# Patient Record
Sex: Female | Born: 1950 | Race: White | Hispanic: No | Marital: Married | Smoking: Never smoker
Health system: Southern US, Community
[De-identification: ages and names within clinical notes are randomized; demographics above are authoritative.]

## PROBLEM LIST (undated history)

## (undated) DIAGNOSIS — E162 Hypoglycemia, unspecified: Secondary | ICD-10-CM

## (undated) DIAGNOSIS — R079 Chest pain, unspecified: Secondary | ICD-10-CM

## (undated) DIAGNOSIS — R42 Dizziness and giddiness: Secondary | ICD-10-CM

## (undated) DIAGNOSIS — R011 Cardiac murmur, unspecified: Secondary | ICD-10-CM

## (undated) DIAGNOSIS — H269 Unspecified cataract: Secondary | ICD-10-CM

## (undated) DIAGNOSIS — I209 Angina pectoris, unspecified: Secondary | ICD-10-CM

## (undated) DIAGNOSIS — R112 Nausea with vomiting, unspecified: Secondary | ICD-10-CM

## (undated) DIAGNOSIS — T7840XA Allergy, unspecified, initial encounter: Secondary | ICD-10-CM

## (undated) DIAGNOSIS — K219 Gastro-esophageal reflux disease without esophagitis: Secondary | ICD-10-CM

## (undated) DIAGNOSIS — Z9889 Other specified postprocedural states: Secondary | ICD-10-CM

## (undated) DIAGNOSIS — Z87442 Personal history of urinary calculi: Secondary | ICD-10-CM

## (undated) HISTORY — DX: Cardiac murmur, unspecified: R01.1

## (undated) HISTORY — DX: Chest pain, unspecified: R07.9

## (undated) HISTORY — PX: CATARACT EXTRACTION: SUR2

## (undated) HISTORY — PX: POLYPECTOMY: SHX149

## (undated) HISTORY — PX: EYE SURGERY: SHX253

## (undated) HISTORY — DX: Unspecified cataract: H26.9

## (undated) HISTORY — DX: Dizziness and giddiness: R42

## (undated) HISTORY — DX: Allergy, unspecified, initial encounter: T78.40XA

## (undated) HISTORY — PX: COLONOSCOPY: SHX174

## (undated) HISTORY — DX: Gastro-esophageal reflux disease without esophagitis: K21.9

## (undated) HISTORY — PX: OTOPLASTY: SUR998

---

## 1999-06-10 ENCOUNTER — Other Ambulatory Visit: Admission: RE | Admit: 1999-06-10 | Discharge: 1999-06-10 | Payer: Self-pay | Admitting: Obstetrics and Gynecology

## 2000-06-20 ENCOUNTER — Other Ambulatory Visit: Admission: RE | Admit: 2000-06-20 | Discharge: 2000-06-20 | Payer: Self-pay | Admitting: Obstetrics and Gynecology

## 2001-07-10 ENCOUNTER — Other Ambulatory Visit: Admission: RE | Admit: 2001-07-10 | Discharge: 2001-07-10 | Payer: Self-pay | Admitting: Obstetrics and Gynecology

## 2001-12-21 ENCOUNTER — Encounter: Admission: RE | Admit: 2001-12-21 | Discharge: 2001-12-21 | Payer: Self-pay | Admitting: Family Medicine

## 2001-12-21 ENCOUNTER — Encounter: Payer: Self-pay | Admitting: Family Medicine

## 2003-01-08 ENCOUNTER — Encounter: Admission: RE | Admit: 2003-01-08 | Discharge: 2003-01-08 | Payer: Self-pay | Admitting: Family Medicine

## 2003-01-08 ENCOUNTER — Encounter: Payer: Self-pay | Admitting: Family Medicine

## 2005-12-30 ENCOUNTER — Encounter: Admission: RE | Admit: 2005-12-30 | Discharge: 2005-12-30 | Payer: Self-pay | Admitting: Family Medicine

## 2006-11-24 ENCOUNTER — Encounter (INDEPENDENT_AMBULATORY_CARE_PROVIDER_SITE_OTHER): Payer: Self-pay | Admitting: *Deleted

## 2006-11-24 ENCOUNTER — Ambulatory Visit (HOSPITAL_COMMUNITY): Admission: RE | Admit: 2006-11-24 | Discharge: 2006-11-24 | Payer: Self-pay | Admitting: Obstetrics and Gynecology

## 2007-08-10 ENCOUNTER — Encounter: Admission: RE | Admit: 2007-08-10 | Discharge: 2007-08-10 | Payer: Self-pay | Admitting: Obstetrics and Gynecology

## 2009-02-24 ENCOUNTER — Encounter (INDEPENDENT_AMBULATORY_CARE_PROVIDER_SITE_OTHER): Payer: Self-pay | Admitting: *Deleted

## 2010-04-22 ENCOUNTER — Encounter: Payer: Self-pay | Admitting: Gastroenterology

## 2011-03-18 NOTE — Op Note (Signed)
Jenny Henderson, Jenny Henderson                ACCOUNT NO.:  [id number]   MEDICAL RECORD NO.:  [id number]          PATIENT TYPE:  AMB   LOCATION:  SDC                           FACILITY:  [facility]   PHYSICIAN:  Zelphia Cairo, MD    DATE OF BIRTH:  29-Jul-1951   DATE OF PROCEDURE:  11/24/2006  DATE OF DISCHARGE:                               OPERATIVE REPORT   PREOPERATIVE DIAGNOSIS:  Post menopausal bleeding.   POSTOPERATIVE DIAGNOSIS:  Endometrial mass, post menopausal bleeding,  pathology pending.   PROCEDURE:  Hysteroscopy, dilatation and curettage, polypectomy.   SURGEON:  Zelphia Cairo, M.D.   ASSISTANT:  None.   ANESTHESIA:  General.   SPECIMEN:  1. Endometrial polyp/mass.  2. Endometrial curettings.   ESTIMATED BLOOD LOSS:  Minimal.   COMPLICATIONS:  None.   CONDITION:  Extubated to the recovery room in stable condition.   DESCRIPTION OF PROCEDURE:  The patient was taken to the operating room  where general anesthesia was obtained. She was placed in the dorsal  lithotomy position using Allen stirrups.  She was prepped and draped in  a sterile fashion.  In and out sterile catheter was used to drain her  bladder for approximately 150 mL of clear urine.  A bivalve speculum was  then placed in the vagina and the cervical os could not be located.  The  bivalve speculum was removed.  On bimanual exam, adhesions of the cervix  to the posterior fornix of the vagina were disrupted manually.  The  bivalve speculum was then placed back into the vagina and a single tooth  tenaculum was used to grasp the anterior lip of the cervix.  A yellow  plastic os finder was used to locate the cervical os and gently dilate  the cervix.  The cervix was then gently dilated using Pratt dilators.   The diagnostic hysteroscope was then inserted into the uterus and the  cavity was observed.  Of note, there was a long slender endometrial  polyp or mass noted upon entering the internal cervical os.   Otherwise,  the endometrium appeared atrophic and without masses or discoloration.  The left ostia was visualized and appeared normal.  The right ostia  could not be visualized.  The endometrial mass or polyp appeared to be  adhered to the right and posterior wall of the uterus.  The hysteroscope  was then removed and polyp forceps were used to remove pieces of the  polyp.  An endometrial curetting was then performed.  The hysteroscope  was then reinserted and the majority of the polyp was found to be  removed.  Only a small amount of its base remained within the uterus.  Specimens were sent off to pathology.  Following D&C, 1% lidocaine was  used to provide local anesthesia.  The posterior cervix had an area of  laceration that was bleeding.  Two figure-of-eight stitches using 2-0  Vicryl were used to provide hemostasis.  The tenaculum and speculum were  then removed from the cervix and the patient was taken to the recovery  room in stable condition.  Zelphia Cairo, MD  Electronically Signed     GA/MEDQ  D:  11/24/2006  T:  11/24/2006  Job:  [id number]

## 2012-08-24 ENCOUNTER — Other Ambulatory Visit: Payer: Self-pay | Admitting: Emergency Medicine

## 2012-08-24 DIAGNOSIS — R0989 Other specified symptoms and signs involving the circulatory and respiratory systems: Secondary | ICD-10-CM

## 2012-08-27 ENCOUNTER — Ambulatory Visit
Admission: RE | Admit: 2012-08-27 | Discharge: 2012-08-27 | Disposition: A | Discharge status: Home / Self Care | Payer: BC Managed Care – PPO | Source: Ambulatory Visit | Attending: Emergency Medicine | Admitting: Emergency Medicine

## 2012-08-27 DIAGNOSIS — R0989 Other specified symptoms and signs involving the circulatory and respiratory systems: Secondary | ICD-10-CM

## 2013-02-22 ENCOUNTER — Other Ambulatory Visit: Payer: Self-pay | Admitting: Otolaryngology

## 2013-02-22 DIAGNOSIS — H903 Sensorineural hearing loss, bilateral: Secondary | ICD-10-CM

## 2013-02-22 DIAGNOSIS — H9312 Tinnitus, left ear: Secondary | ICD-10-CM

## 2013-03-01 ENCOUNTER — Other Ambulatory Visit: Payer: Self-pay | Admitting: Otolaryngology

## 2013-03-01 ENCOUNTER — Ambulatory Visit
Admission: RE | Admit: 2013-03-01 | Discharge: 2013-03-01 | Disposition: A | Discharge status: Home / Self Care | Payer: BC Managed Care – PPO | Source: Ambulatory Visit | Attending: Otolaryngology | Admitting: Otolaryngology

## 2013-03-01 DIAGNOSIS — H903 Sensorineural hearing loss, bilateral: Secondary | ICD-10-CM

## 2013-03-01 DIAGNOSIS — H9312 Tinnitus, left ear: Secondary | ICD-10-CM

## 2013-03-01 MED ORDER — GADOBENATE DIMEGLUMINE 529 MG/ML IV SOLN
13.0000 mL | Freq: Once | INTRAVENOUS | Status: AC | PRN
  Administered 2013-03-01: 13 mL via INTRAVENOUS

## 2013-03-03 ENCOUNTER — Other Ambulatory Visit: Payer: BC Managed Care – PPO

## 2013-03-04 ENCOUNTER — Inpatient Hospital Stay: Admission: RE | Admit: 2013-03-04 | Payer: BC Managed Care – PPO | Source: Ambulatory Visit

## 2013-03-07 ENCOUNTER — Other Ambulatory Visit: Payer: BC Managed Care – PPO

## 2014-04-07 ENCOUNTER — Encounter: Payer: Self-pay | Admitting: Gastroenterology

## 2014-06-24 ENCOUNTER — Encounter: Payer: Self-pay | Admitting: Gastroenterology

## 2014-08-04 ENCOUNTER — Ambulatory Visit (AMBULATORY_SURGERY_CENTER): Payer: Self-pay

## 2014-08-04 ENCOUNTER — Telehealth: Payer: Self-pay | Admitting: Gastroenterology

## 2014-08-04 VITALS — Ht 62.0 in | Wt 139.2 lb

## 2014-08-04 DIAGNOSIS — Z1211 Encounter for screening for malignant neoplasm of colon: Secondary | ICD-10-CM

## 2014-08-04 MED ORDER — SUPREP BOWEL PREP KIT 17.5-3.13-1.6 GM/177ML PO SOLN: 1.0000 | Freq: Once | ORAL | Status: DC

## 2014-08-04 NOTE — Progress Notes (Signed)
No allergies to eggs or soy  No past problems with anesthesia (PONV with general) No diet/weight loss meds No home oxygen  Has email  Emmi instructions given for colonoscopy

## 2014-08-13 NOTE — Telephone Encounter (Signed)
[facility] [place] states pt picked up her prep on 10-6. Called home number and left a message for pt to call us if she has issues with her prep still.   Lelan Pons PV

## 2014-08-14 ENCOUNTER — Telehealth: Payer: Self-pay

## 2014-08-14 NOTE — Telephone Encounter (Signed)
Pt called stating she ws returning a phone call regarding the bowel prep. Pt states Suprep bowel prep was sent to [place], [place] instead of [place], [place]. [facility] [place] [facility] called the pt and corrected the mistake. The pt does have the correct bowel prep and wanted [place] to know. She will call back if she has any further questions or problems.

## 2014-08-18 ENCOUNTER — Encounter: Payer: Self-pay | Admitting: Gastroenterology

## 2014-08-18 ENCOUNTER — Ambulatory Visit (AMBULATORY_SURGERY_CENTER): Payer: BC Managed Care – PPO | Admitting: Gastroenterology

## 2014-08-18 VITALS — BP 101/57 | HR 63 | Temp 97.5°F | Resp 20 | Ht 62.0 in | Wt 139.0 lb

## 2014-08-18 DIAGNOSIS — D125 Benign neoplasm of sigmoid colon: Secondary | ICD-10-CM

## 2014-08-18 DIAGNOSIS — Z1211 Encounter for screening for malignant neoplasm of colon: Secondary | ICD-10-CM

## 2014-08-18 DIAGNOSIS — K5730 Diverticulosis of large intestine without perforation or abscess without bleeding: Secondary | ICD-10-CM

## 2014-08-18 MED ORDER — SODIUM CHLORIDE 0.9 % IV SOLN: 500.0000 mL | INTRAVENOUS | Status: DC

## 2014-08-18 NOTE — Patient Instructions (Signed)
YOU HAD AN ENDOSCOPIC PROCEDURE TODAY AT THE [facility]: Refer to the procedure report that was given to you for any specific questions about what was found during the examination.  If the procedure report does not answer your questions, please call your gastroenterologist to clarify.  If you requested that your care partner not be given the details of your procedure findings, then the procedure report has been included in a sealed envelope for you to review at your convenience later.  YOU SHOULD EXPECT: Some feelings of bloating in the abdomen. Passage of more gas than usual.  Walking can help get rid of the air that was put into your GI tract during the procedure and reduce the bloating. If you had a lower endoscopy (such as a colonoscopy or flexible sigmoidoscopy) you may notice spotting of blood in your stool or on the toilet paper. If you underwent a bowel prep for your procedure, then you may not have a normal bowel movement for a few days.  DIET: Your first meal following the procedure should be a light meal and then it is ok to progress to your normal diet.  A half-sandwich or bowl of soup is an example of a good first meal.  Heavy or fried foods are harder to digest and may make you feel nauseous or bloated.  Likewise meals heavy in dairy and vegetables can cause extra gas to form and this can also increase the bloating.  Drink plenty of fluids but you should avoid alcoholic beverages for 24 hours.  ACTIVITY: Your care partner should take you home directly after the procedure.  You should plan to take it easy, moving slowly for the rest of the day.  You can resume normal activity the day after the procedure however you should NOT DRIVE or use heavy machinery for 24 hours (because of the sedation medicines used during the test).    SYMPTOMS TO REPORT IMMEDIATELY: A gastroenterologist can be reached at any hour.  During normal business hours, 8:30 AM to 5:00 PM Monday through Friday,  call [phone].  After hours and on weekends, please call the GI answering service at [phone] who will take a message and have the physician on call contact you.   Following lower endoscopy (colonoscopy or flexible sigmoidoscopy):  Excessive amounts of blood in the stool  Significant tenderness or worsening of abdominal pains  Swelling of the abdomen that is new, acute  Fever of 100F or higher   FOLLOW UP: If any biopsies were taken you will be contacted by phone or by letter within the next 1-3 weeks.  Call your gastroenterologist if you have not heard about the biopsies in 3 weeks.  Our staff will call the home number listed on your records the next business day following your procedure to check on you and address any questions or concerns that you may have at that time regarding the information given to you following your procedure. This is a courtesy call and so if there is no answer at the home number and we have not heard from you through the emergency physician on call, we will assume that you have returned to your regular daily activities without incident.  SIGNATURES/CONFIDENTIALITY: You and/or your care partner have signed paperwork which will be entered into your electronic medical record.  These signatures attest to the fact that that the information above on your After Visit Summary has been reviewed and is understood.  Full responsibility of the confidentiality of   this discharge information lies with you and/or your care-partner.   Resume medications. Information on polyps, diverticulosis and high fiber diet given with discharge instructions. 

## 2014-08-18 NOTE — Op Note (Signed)
[place]  [address] [place], [id number]   COLONOSCOPY PROCEDURE REPORT  PATIENT: Kaylanni, Ezelle  MR#: [id number] BIRTHDATE: July 19, 1951 , 7  yrs. old GENDER: female ENDOSCOPIST: Inda Castle, MD REFERRED BY: PROCEDURE DATE:  08/18/2014 PROCEDURE:   Colonoscopy with snare polypectomy First Screening Colonoscopy - Avg.  risk and is 50 yrs.  old or older - No.  Prior Negative Screening - Now for repeat screening. 10 or more years since last screening  History of Adenoma - Now for follow-up colonoscopy & has been > or = to 3 yrs.  N/A  Polyps Removed Today? Yes. ASA CLASS:   Class II INDICATIONS:average risk for colon cancer. MEDICATIONS: Monitored anesthesia care and Propofol 200 mg IV  DESCRIPTION OF PROCEDURE:   After the risks benefits and alternatives of the procedure were thoroughly explained, informed consent was obtained.  The digital rectal exam revealed no abnormalities of the rectum.   The LB [id number]  endoscope was introduced through the anus and advanced to the cecum, which was identified by both the appendix and ileocecal valve. No adverse events experienced.   The quality of the prep was excellent using Suprep  The instrument was then slowly withdrawn as the colon was fully examined.      COLON FINDINGS: A sessile polyp measuring 4 mm in size was found in the sigmoid colon.   There was mild diverticulosis noted in the sigmoid colon.  Retroflexed views revealed no abnormalities. The time to cecum=3 minutes 03 seconds.  Withdrawal time=7 minutes 03 seconds.  The scope was withdrawn and the procedure completed. COMPLICATIONS: There were no immediate complications.  ENDOSCOPIC IMPRESSION: 1.   Sessile polyp measuring 4 mm in size was found in the sigmoid colon 2.   Mild diverticulosis was noted in the sigmoid colon  RECOMMENDATIONS: If the polyp(s) removed today are proven to be adenomatous (pre-cancerous) polyps, you will  need a repeat colonoscopy in 5 years.  Otherwise you should continue to follow colorectal cancer screening guidelines for "routine risk" patients with colonoscopy in 10 years.  You will receive a letter within 1-2 weeks with the results of your biopsy as well as final recommendations.  Please call my office if you have not received a letter after 3 weeks.  eSigned:  Inda Castle, MD 08/18/2014 8:44 AM   cc: Emmie Niemann, PA   PATIENT NAME:  Chandrea, Zellman MR#: [id number]

## 2014-08-18 NOTE — Progress Notes (Signed)
Pt stable to RR 

## 2014-08-18 NOTE — Progress Notes (Signed)
Called to room to assist during endoscopic procedure.  Patient ID and intended procedure confirmed with present staff. Received instructions for my participation in the procedure from the performing physician.  

## 2014-08-19 ENCOUNTER — Telehealth: Payer: Self-pay | Admitting: *Deleted

## 2014-08-19 NOTE — Telephone Encounter (Signed)
  Follow up Call-  Call back number 08/18/2014  Post procedure Call Back phone  # [phone]  Permission to leave phone message Yes     Patient questions:  Do you have a fever, pain , or abdominal swelling? No. Pain Score  0 *  Have you tolerated food without any problems? Yes.    Have you been able to return to your normal activities? Yes.    Do you have any questions about your discharge instructions: Diet   No. Medications  No. Follow up visit  No.  Do you have questions or concerns about your Care? No.  Actions: * If pain score is 4 or above: No action needed, pain <4.

## 2014-08-25 ENCOUNTER — Encounter: Payer: Self-pay | Admitting: Gastroenterology

## 2014-09-12 ENCOUNTER — Other Ambulatory Visit: Payer: Self-pay | Admitting: Obstetrics and Gynecology

## 2014-09-15 LAB — CYTOLOGY - PAP

## 2014-12-30 DIAGNOSIS — I209 Angina pectoris, unspecified: Secondary | ICD-10-CM

## 2014-12-30 HISTORY — DX: Angina pectoris, unspecified: I20.9

## 2015-01-27 ENCOUNTER — Encounter (HOSPITAL_COMMUNITY): Payer: Self-pay | Admitting: Emergency Medicine

## 2015-01-27 ENCOUNTER — Observation Stay (HOSPITAL_COMMUNITY)
Admission: EM | Admit: 2015-01-27 | Discharge: 2015-01-28 | Disposition: A | Discharge status: Home / Self Care | Payer: BLUE CROSS/BLUE SHIELD | Attending: Family Medicine | Admitting: Family Medicine

## 2015-01-27 ENCOUNTER — Other Ambulatory Visit (HOSPITAL_COMMUNITY): Payer: Self-pay

## 2015-01-27 ENCOUNTER — Emergency Department (HOSPITAL_COMMUNITY): Payer: BLUE CROSS/BLUE SHIELD

## 2015-01-27 DIAGNOSIS — K219 Gastro-esophageal reflux disease without esophagitis: Secondary | ICD-10-CM | POA: Diagnosis not present

## 2015-01-27 DIAGNOSIS — R079 Chest pain, unspecified: Secondary | ICD-10-CM | POA: Diagnosis not present

## 2015-01-27 DIAGNOSIS — Z7951 Long term (current) use of inhaled steroids: Secondary | ICD-10-CM | POA: Insufficient documentation

## 2015-01-27 DIAGNOSIS — Z79899 Other long term (current) drug therapy: Secondary | ICD-10-CM | POA: Diagnosis not present

## 2015-01-27 DIAGNOSIS — Z9889 Other specified postprocedural states: Secondary | ICD-10-CM | POA: Diagnosis not present

## 2015-01-27 HISTORY — DX: Chest pain, unspecified: R07.9

## 2015-01-27 LAB — CBC
HCT: 41.8 % (ref 36.0–46.0)
HEMATOCRIT: 41.1 % (ref 36.0–46.0)
Hemoglobin: 14 g/dL (ref 12.0–15.0)
Hemoglobin: 14.2 g/dL (ref 12.0–15.0)
MCH: 30.7 pg (ref 26.0–34.0)
MCH: 30.5 pg (ref 26.0–34.0)
MCHC: 34.1 g/dL (ref 30.0–36.0)
MCHC: 34 g/dL (ref 30.0–36.0)
MCV: 90.1 fL (ref 78.0–100.0)
MCV: 89.9 fL (ref 78.0–100.0)
Platelets: 288 10*3/uL (ref 150–400)
Platelets: 303 10*3/uL (ref 150–400)
RBC: 4.56 MIL/uL (ref 3.87–5.11)
RBC: 4.65 MIL/uL (ref 3.87–5.11)
RDW: 12.2 % (ref 11.5–15.5)
RDW: 12.1 % (ref 11.5–15.5)
WBC: 9.2 10*3/uL (ref 4.0–10.5)
WBC: 8.2 10*3/uL (ref 4.0–10.5)

## 2015-01-27 LAB — I-STAT TROPONIN, ED: Troponin i, poc: 0 ng/mL (ref 0.00–0.08)

## 2015-01-27 LAB — BASIC METABOLIC PANEL
Anion gap: 5 (ref 5–15)
BUN: 22 mg/dL (ref 6–23)
CALCIUM: 9.9 mg/dL (ref 8.4–10.5)
CO2: 29 mmol/L (ref 19–32)
Chloride: 103 mmol/L (ref 96–112)
Creatinine, Ser: 0.78 mg/dL (ref 0.50–1.10)
GFR, EST NON AFRICAN AMERICAN: 86 mL/min — AB (ref >90)
Glucose, Bld: 87 mg/dL (ref 70–99)
POTASSIUM: 4.2 mmol/L (ref 3.5–5.1)
Sodium: 137 mmol/L (ref 135–145)

## 2015-01-27 LAB — TROPONIN I
Troponin I: <0.03 ng/mL (ref <0.031)
Troponin I: <0.03 ng/mL (ref <0.031)

## 2015-01-27 MED ORDER — ASPIRIN EC 81 MG PO TBEC
81.0000 mg | DELAYED_RELEASE_TABLET | Freq: Every day | ORAL | Status: DC
  Administered 2015-01-28: 81 mg via ORAL
  Filled 2015-01-27: qty 1

## 2015-01-27 MED ORDER — PANTOPRAZOLE SODIUM 40 MG PO TBEC
40.0000 mg | DELAYED_RELEASE_TABLET | Freq: Every day | ORAL | Status: DC
  Administered 2015-01-27 – 2015-01-28 (×2): 40 mg via ORAL
  Filled 2015-01-27 (×2): qty 1

## 2015-01-27 MED ORDER — FLUTICASONE PROPIONATE 50 MCG/ACT NA SUSP
1.0000 | Freq: Every day | NASAL | Status: DC
  Filled 2015-01-27: qty 16

## 2015-01-27 MED ORDER — ENOXAPARIN SODIUM 40 MG/0.4ML ~~LOC~~ SOLN
40.0000 mg | SUBCUTANEOUS | Status: DC
  Administered 2015-01-27: 40 mg via SUBCUTANEOUS
  Filled 2015-01-27: qty 0.4

## 2015-01-27 MED ORDER — ACETAMINOPHEN 325 MG PO TABS
650.0000 mg | ORAL_TABLET | ORAL | Status: DC | PRN
  Administered 2015-01-28: 650 mg via ORAL
  Filled 2015-01-27: qty 2

## 2015-01-27 MED ORDER — GI COCKTAIL ~~LOC~~: 30.0000 mL | Freq: Four times a day (QID) | ORAL | Status: DC | PRN

## 2015-01-27 MED ORDER — ONDANSETRON HCL 4 MG/2ML IJ SOLN: 4.0000 mg | Freq: Four times a day (QID) | INTRAMUSCULAR | Status: DC | PRN

## 2015-01-27 MED ORDER — ALPRAZOLAM 0.25 MG PO TABS: 0.2500 mg | ORAL_TABLET | Freq: Two times a day (BID) | ORAL | Status: DC | PRN

## 2015-01-27 NOTE — ED Notes (Addendum)
Attempted report x1. 

## 2015-01-27 NOTE — ED Provider Notes (Signed)
CSN: [id number]     Arrival date & time 01/27/15  1238 History   First MD Initiated Contact with Patient 01/27/15 1243     Chief Complaint  Patient presents with  . Chest Pain     (Consider location/radiation/quality/duration/timing/severity/associated sxs/prior Treatment) HPI  Jenny Henderson is a 64 y.o. female with PMH of GERD presenting with chest pain that started at 10:30 AM this morning while patient was at rest she took an antacid without improvement. Patient denied any exertional symptoms no shortness of breath. She did develop nausea as well as diaphoresis when she went to the restroom. Patient came to the emergency room and was given 324 aspirin and the pain completely resolved. She denies any prior history of heart disease. No stress test or cardiac catheterization. No retention, hyperlipidemia or diabetes. Patient states heart disease runs in her family she is not obese. Never smoker. Pt denies history of DVT, PE, recent surgery or trauma, malignancy, hemoptysis, exogenous estrogen use, unilateral leg swelling or tenderness, immobilization.   Past Medical History  Diagnosis Date  . GERD (gastroesophageal reflux disease)    Past Surgical History  Procedure Laterality Date  . Cesarean section    . Otoplasty      bilat, OD x 2  . Cataract extraction      left eye   Family History  Problem Relation Age of Onset  . Alzheimer's disease Mother     69 yrs  . CVA Mother 59  . Colon cancer Paternal Grandmother    History  Substance Use Topics  . Smoking status: Never Smoker   . Smokeless tobacco: Not on file  . Alcohol Use: 0.6 oz/week    1 Glasses of wine per week   OB History    No data available     Review of Systems 10 Systems reviewed and are negative for acute change except as noted in the HPI.    Allergies  Codeine  Home Medications   Prior to Admission medications   Medication Sig Start Date End Date Taking? Authorizing Provider  fluticasone (FLONASE)  50 MCG/ACT nasal spray Place 1 spray into both nostrils daily.   Yes Historical Provider, MD  lansoprazole (PREVACID) 15 MG capsule Take 15 mg by mouth daily at 12 noon.   Yes Historical Provider, MD  predniSONE (STERAPRED UNI-PAK) 5 MG TABS tablet Take 5 mg by mouth daily. Dose pack. Finished on 01-26-15   Yes Historical Provider, MD   BP 98/59 mmHg  Pulse 77  Temp(Src) 98.3 F (36.8 C) (Oral)  Resp 18  Ht 5\' 2"  (1.575 m)  Wt 139 lb 14.4 oz (63.458 kg)  BMI 25.58 kg/m2  SpO2 98% Physical Exam  Constitutional: She appears well-developed and well-nourished. No distress.  HENT:  Head: Normocephalic and atraumatic.  Eyes: Conjunctivae and EOM are normal. Right eye exhibits no discharge. Left eye exhibits no discharge.  Neck: No JVD present.  Cardiovascular: Normal rate and regular rhythm.   No leg swelling or tenderness. Negative Homan's sign.  Pulmonary/Chest: Effort normal and breath sounds normal. No respiratory distress. She has no wheezes.  Abdominal: Soft. Bowel sounds are normal. She exhibits no distension. There is no tenderness.  Neurological: She is alert. She exhibits normal muscle tone. Coordination normal.  Skin: Skin is warm and dry. She is not diaphoretic.  Nursing note and vitals reviewed.   ED Course  Procedures (including critical care time) Labs Review Labs Reviewed  BASIC METABOLIC PANEL - Abnormal; Notable for the  following:    GFR calc non Af Amer 86 (*)    All other components within normal limits  CBC  CBC  TROPONIN I  TROPONIN I  TROPONIN I  I-STAT TROPOININ, ED    Imaging Review Dg Chest 2 View  01/27/2015   CLINICAL DATA:  Substernal chest pain radiating to back for 1 day  EXAM: CHEST  2 VIEW  COMPARISON:  None.  FINDINGS: Lungs are clear. Heart size and pulmonary vascularity are normal. No adenopathy. No pneumothorax. No bone lesions. Aorta is mildly tortuous.  IMPRESSION: No edema or consolidation.   Electronically Signed   By: Lowella Grip  III M.D.   On: 01/27/2015 13:22     EKG Interpretation None      ED ECG REPORT   Date: 01/27/2015  Rate: 62  Rhythm: normal sinus rhythm  QRS Axis: normal  Intervals: normal  ST/T Wave abnormalities: indeterminate EKG read with minimal ST elevation in inferior leads  Conduction Disutrbances:none  Narrative Interpretation: sinus rhythm. Minimal ST elevation in inferior leads.  Old EKG Reviewed: none available   MDM   Final diagnoses:  Chest pain, unspecified chest pain type   Patient presenting with central chest pain described as a pressure as well as nausea and diaphoresis that completely resolved with aspirin. Patient's risk factors include family history. VSS. No JVD or peripheral edema. Patient's troponin negative. EKG with questionable/minimal ST elevation in inferior leads no prior to compare to. X-ray negative. Concern for possible cardiac etiology of chest pain. We'll admit to hospitalist for chest pain rule out. Spoke with Dr. Clementeen Graham and agrees to evaluate the patient for admission to telemetry bed.  Discussed return precautions with patient. Discussed all results and patient verbalizes understanding and agrees with plan.  This is a shared patient. This patient was discussed with the physician who saw and evaluated the patient and agrees with the plan.     Al Corpus, PA-C 01/27/15 1628  Milton Ferguson, MD 01/28/15 1040

## 2015-01-27 NOTE — ED Notes (Signed)
Per EMS: pt was at work when she started to develop substernal chest pain with radiation to back. Pt states she thought it was GERD but then became diaphoretic and also nauseas. RN on staff gave pt 324mg  of aspirin and states pain went away. Pt currently denies any cp at this time.

## 2015-01-27 NOTE — ED Notes (Signed)
Attempted report x 2 

## 2015-01-27 NOTE — H&P (Signed)
[facility] History and Physical  Jenny Henderson YIR:[id number] DOB: 10-17-1951 DOA: 01/27/2015  Referring physician: ED PCP: Tula Nakayama   Chief Complaint:  Chest pain since one day  HPI:  64 year old female with history of GERD presented with acute onset chest pain since this morning. Patient reports being at work and started having substernal chest pain which feels like tightness and she starts she had indigestion. He to some antacid without improvement. She reports having some nausea and some diaphoresis. She reports having a mild similar symptoms before going to bed last night also. She has history of GERD but denies the symptoms to be similar to this. The pain worsened as tightness around the chest and radiating to the back. The pain was about 6/10 in severe day. She reports that she had a lot of stress at work lately as well as this morning. Denies any cardiac history in the past.  Patient denies headache, dizziness, fever, chills, , vomiting,  palpitations, SOB, abdominal pain, bowel or urinary symptoms. Denies change in weight or appetite.  Patient was brought to the ED and given full dose aspirin after which her pain completely subsided. Vitals were stable. Blood work including initial appointment was unremarkable. Chest x-ray was unremarkable. EKG showed sinus rhythm with ST changes in inferior leads. Hospitalist admission requested   Review of Systems:  Constitutional: Diaphoresis, Denies fever, chills, ppetite change and fatigue.  HEENT: Denies visual or hearing symptoms, congestion, difficulty swallowing, neck pain or stiffness Respiratory: Denies SOB, DOE, cough,  and wheezing.   Cardiovascular: Chest pain++, palpitations and leg swelling.  Gastrointestinal: Denies nausea, vomiting, abdominal pain, diarrhea, constipation, blood in stool and abdominal distention.  Genitourinary: Denies dysuria,  hematuria, flank pain and difficulty urinating.  Endocrine: ,  Denies: hot or cold intolerance, changes in hair or nails, polyuria, polydipsia. Musculoskeletal: Denies myalgias, back pain,Jt pain or swelling  Skin: Denies  rash and wound.  Neurological: Denies dizziness, seizures, syncope, weakness, light-headedness, numbness and headaches.  Hematological: Denies adenopathy. Psychiatric/Behavioral: Denies confusion  Past Medical History  Diagnosis Date  . GERD (gastroesophageal reflux disease)    Past Surgical History  Procedure Laterality Date  . Cesarean section    . Otoplasty      bilat, OD x 2  . Cataract extraction      left eye   Social History:  reports that she has never smoked. She does not have any smokeless tobacco history on file. She reports that she drinks about 0.6 oz of alcohol per week. She reports that she does not use illicit drugs.  Allergies  Allergen Reactions  . Codeine Nausea And Vomiting    Family History  Problem Relation Age of Onset  . Alzheimer's disease Mother     23 yrs  . CVA Mother 27  . Colon cancer Paternal Grandmother    father had heart disease  Prior to Admission medications   Medication Sig Start Date End Date Taking? Authorizing Provider  fluticasone (FLONASE) 50 MCG/ACT nasal spray Place 1 spray into both nostrils daily.   Yes Historical Provider, MD  lansoprazole (PREVACID) 15 MG capsule Take 15 mg by mouth daily at 12 noon.   Yes Historical Provider, MD  predniSONE (STERAPRED UNI-PAK) 5 MG TABS tablet Take 5 mg by mouth daily. Dose pack. Finished on 01-26-15   Yes Historical Provider, MD     Physical Exam:  Filed Vitals:   01/27/15 1400 01/27/15 1430 01/27/15 1445 01/27/15 1515  BP: 104/62 116/68 134/60 143/71  Pulse: 65 62 69 73  Temp:      TempSrc:      Resp: 17 20 18 18   Height:      Weight:      SpO2: 97% 97% 97% 97%    Constitutional: Vital signs reviewed.  Elderly female in no acute distress HEENT: no pallor, no icterus, moist oral mucosa, no cervical  lymphadenopathy Cardiovascular: RRR, S1 normal, S2 normal, no MRG Chest: CTAB, no wheezes, rales, or rhonchi Abdominal: Soft. Non-tender, non-distended, bowel sounds are normal,  Ext: warm, no edema Neurological: Alert and oriented  Labs on Admission:  Basic Metabolic Panel:  Recent Labs Lab 01/27/15 1330  NA 137  K 4.2  CL 103  CO2 29  GLUCOSE 87  BUN 22  CREATININE 0.78  CALCIUM 9.9   Liver Function Tests: No results for input(s): AST, ALT, ALKPHOS, BILITOT, PROT, ALBUMIN in the last 168 hours. No results for input(s): LIPASE, AMYLASE in the last 168 hours. No results for input(s): AMMONIA in the last 168 hours. CBC:  Recent Labs Lab 01/27/15 1330  WBC 9.2  HGB 14.0  HCT 41.1  MCV 90.1  PLT 288   Cardiac Enzymes: No results for input(s): CKTOTAL, CKMB, CKMBINDEX, TROPONINI in the last 168 hours. BNP: Invalid input(s): POCBNP CBG: No results for input(s): GLUCAP in the last 168 hours.  Radiological Exams on Admission: Dg Chest 2 View  01/27/2015   CLINICAL DATA:  Substernal chest pain radiating to back for 1 day  EXAM: CHEST  2 VIEW  COMPARISON:  None.  FINDINGS: Lungs are clear. Heart size and pulmonary vascularity are normal. No adenopathy. No pneumothorax. No bone lesions. Aorta is mildly tortuous.  IMPRESSION: No edema or consolidation.   Electronically Signed   By: Lowella Grip III M.D.   On: 01/27/2015 13:22    EKG: Normal sinus rhythm with mild ST elevation in inf leads ( when evaluated from 2012, unchanged.  Assessment/Plan  Principal Problem:   Chest pain at rest Appears to be atypical and associated with GERD versus underlying stress. Heart score is 3. EKG has some ST changes in inferior leads but when compared to her last EKG that she brought from 2012 it appears unchanged. Initial troponin is negative. Will admit on observation on telemetry, cycle troponins and EKGs. Will obtain 2-D echo. If symptoms do not recur and serial enzymes are  negative. Patient can be discharged home after 2-D echo tomorrow.   Active Problems:   GERD (gastroesophageal reflux disease) Continue GI     Diet:cardiac  DVT prophylaxis: sq lovenox   Code Status: Full Code Family Communication: Husband at bedside Disposition Plan: observation to telemetry  [place], [facility] Pager [phone]  Total time spent on admission :45 minutes  If 7PM-7AM, please contact night-coverage [email or web address] Password [facility] 01/27/2015, 3:48 PM

## 2015-01-28 ENCOUNTER — Other Ambulatory Visit: Payer: Self-pay | Admitting: Cardiology

## 2015-01-28 DIAGNOSIS — Z7951 Long term (current) use of inhaled steroids: Secondary | ICD-10-CM | POA: Diagnosis not present

## 2015-01-28 DIAGNOSIS — R079 Chest pain, unspecified: Principal | ICD-10-CM

## 2015-01-28 DIAGNOSIS — Z79899 Other long term (current) drug therapy: Secondary | ICD-10-CM | POA: Diagnosis not present

## 2015-01-28 DIAGNOSIS — K219 Gastro-esophageal reflux disease without esophagitis: Secondary | ICD-10-CM | POA: Diagnosis not present

## 2015-01-28 LAB — TROPONIN I

## 2015-01-28 MED ORDER — ASPIRIN 81 MG PO TBEC: 81.0000 mg | DELAYED_RELEASE_TABLET | Freq: Every day | ORAL | Status: DC

## 2015-01-28 NOTE — Discharge Summary (Signed)
Physician Discharge Summary  Jenny Henderson ZOX:[id number] DOB: 02/03/1951 DOA: 01/27/2015  PCP: Tula Nakayama  Admit date: 01/27/2015 Discharge date: 01/28/2015  Time spent: 45 minutes  Recommendations for Outpatient Follow-up:  1. Follow-up with her primary provider and see this provider for regular scheduled care in about 3-4 weeks 2. Follow-up as indicated I cardiologist for stress test 3. Continue aspirin 81 mg ongoing-patient cautioned about warning signs and bleeding 4. Recommend outpatient CBC plus Chem-12  5.  Discharge Diagnoses:  Principal Problem:   Chest pain at rest Active Problems:   GERD (gastroesophageal reflux disease)   Discharge Condition: Stable  Diet recommendation: Heart healthy low-salt  Filed Weights   01/27/15 1242 01/27/15 1551 01/28/15 0559  Weight: 63.957 kg (141 lb) 63.458 kg (139 lb 14.4 oz) 62.959 kg (138 lb 12.8 oz)    History of present illness:  This 64 y/o ?, Bland PMH, sessile 4 mm polyp 07/2049 colonoscopy admitted from her job at's [employer] 01/27/15 States that he started to have central chest pain with radiation to the back X 40 minutes. Borrowed Tums tablets from coworker without relief Took aspirin chewable and experienced relief. No recurrent chest pain On admission labs entirely unremarkable, troponin next 3 negative, EKG = sinus rhythm with mild J-point elevation across precordial leads Brought into the hospital and obstacle overnight Cardiology consulted-Dr. Aundra Dubin recommended outpatient stress test given unclear etiology of pain-risk factors = no immature cardiac disease however CVA in mother, patient nonsmoker The patientwas felt to have met clinical and metabolic parameters for safe discharge on 01/28/15 and will be discharged to home    Consultations:  Cardiology  Discharge Exam: Filed Vitals:   01/28/15 0559  BP: 99/54  Pulse: 68  Temp: 98 F (36.7 C)  Resp: 18   Pleasant alert oriented, eating breakfast  without issue No further chest pain at present No fever no chills no dysuria   General: EOMI NCAT, looking older than stated age Cardiovascular: S1-S2 no murmur rub or gallop Respiratory: Clinically clear No lower extremity edema Neurologically intact  Discharge Instructions   Discharge Instructions    Diet - low sodium heart healthy    Complete by:  As directed      Discharge instructions    Complete by:  As directed   Your chest pain was suspicious for potential heart issues and that is why cardiology is elected to schedule you for stress test coming up soon They will contact her directly with regards to this stress test I would recommend continuing aspirin over-the-counter 81 mg for secondary prevention of what we discussed eating ASCVD/plaque   I would also recommend transitioning to a less stressful type of roll or position as by her own admission UR type A personality which is associated with numerous deterious effects  Take care of yourself and please also find a primary provider that you can see regularly     Increase activity slowly    Complete by:  As directed           Current Discharge Medication List    START taking these medications   Details  aspirin EC 81 MG EC tablet Take 1 tablet (81 mg total) by mouth daily.      CONTINUE these medications which have NOT CHANGED   Details  fluticasone (FLONASE) 50 MCG/ACT nasal spray Place 1 spray into both nostrils daily.    lansoprazole (PREVACID) 15 MG capsule Take 15 mg by mouth daily at 12 noon.   Associated Diagnoses:  Special screening for malignant neoplasms, colon    predniSONE (STERAPRED UNI-PAK) 5 MG TABS tablet Take 5 mg by mouth daily. Dose pack. Finished on 01-26-15       Allergies  Allergen Reactions  . Codeine Nausea And Vomiting      The results of significant diagnostics from this hospitalization (including imaging, microbiology, ancillary and laboratory) are listed below for reference.     Significant Diagnostic Studies: Dg Chest 2 View  01/27/2015   CLINICAL DATA:  Substernal chest pain radiating to back for 1 day  EXAM: CHEST  2 VIEW  COMPARISON:  None.  FINDINGS: Lungs are clear. Heart size and pulmonary vascularity are normal. No adenopathy. No pneumothorax. No bone lesions. Aorta is mildly tortuous.  IMPRESSION: No edema or consolidation.   Electronically Signed   By: Lowella Grip III M.D.   On: 01/27/2015 13:22    Microbiology: No results found for this or any previous visit (from the past 240 hour(s)).   Labs: Basic Metabolic Panel:  Recent Labs Lab 01/27/15 1330  NA 137  K 4.2  CL 103  CO2 29  GLUCOSE 87  BUN 22  CREATININE 0.78  CALCIUM 9.9   Liver Function Tests: No results for input(s): AST, ALT, ALKPHOS, BILITOT, PROT, ALBUMIN in the last 168 hours. No results for input(s): LIPASE, AMYLASE in the last 168 hours. No results for input(s): AMMONIA in the last 168 hours. CBC:  Recent Labs Lab 01/27/15 1330 01/27/15 1700  WBC 9.2 8.2  HGB 14.0 14.2  HCT 41.1 41.8  MCV 90.1 89.9  PLT 288 303   Cardiac Enzymes:  Recent Labs Lab 01/27/15 1700 01/27/15 2145 01/28/15 0418  TROPONINI <0.03 <0.03 <0.03   BNP: BNP (last 3 results) No results for input(s): BNP in the last 8760 hours.  ProBNP (last 3 results) No results for input(s): PROBNP in the last 8760 hours.  CBG: No results for input(s): GLUCAP in the last 168 hours.     SignedNita Sells  [facility] 01/28/2015, 8:56 AM

## 2015-01-28 NOTE — Progress Notes (Signed)
UR completed 

## 2015-01-28 NOTE — Discharge Instructions (Signed)
Nothing to eat or drink after 10:00 am 01/30/15 before stress test.  Wear 2 piece clothing and comfortable walking shoes,tennis shoes to walk on treadmill. No perfume or strong deodorant.  No caffeine for 12 hours before the test.  The test is at [facility]  See address on appt.  And the follow up appointment is at [facility].

## 2015-01-28 NOTE — Consult Note (Signed)
CARDIOLOGY CONSULT NOTE  Patient ID: Jenny Henderson MRN: [id number] DOB/AGE: September 17, 1951 64 y.o.  Admit date: 01/27/2015 Primary Cardiologist: New Reason for Consultation: Chest pain  HPI: 64 yo with history of GERD presented yesterday with chest pain.  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  This persisted despite taking Tums.  She went to the see the nurse at work and was given ASA, with complete resolution of chest pain (lasted for about 45 minutes total).  She was sent to the ER and was admitted for observation.  The night prior, she had had some chest pain after eating dinner that was more classic for her typical GERD.  The pain yesterday was not like her typical GERD and not really associated with a meal.  Also not associated with exertion.  She works out on a treadmill 45 minutes 3-4 days a week with no exertional dyspnea or chest pain.  She has been under a lot of stress at work.   She has had no further chest pain since her work nurse gave her ASA yesterday.  ECG had very slight ST elevation inferiorly, but per report this was the same as her prior ECG that she brought in (apparently still in ER).  Cardiac enzymes negative x 3.    Review of systems complete and found to be negative unless listed above in HPI  Past Medical History: 1. GERD  Family History  Problem Relation Age of Onset  . Alzheimer's disease Mother     103 yrs  . CVA Mother 57  . Colon cancer Paternal Grandmother   Father with CHF  History   Social History  . Marital Status: Married    Spouse Name: N/A  . Number of Children: N/A  . Years of Education: N/A   Occupational History  . Not on file.   Social History Main Topics  . Smoking status: Never Smoker   . Smokeless tobacco: Never Used  . Alcohol Use: 0.6 oz/week    1 Glasses of wine per week  . Drug Use: No  .  Sexual Activity: Not on file   Other Topics Concern  . Not on file   Social History Narrative     Prescriptions prior to admission  Medication Sig Dispense Refill Last Dose  . fluticasone (FLONASE) 50 MCG/ACT nasal spray Place 1 spray into both nostrils daily.   01/26/2015 at Unknown time  . lansoprazole (PREVACID) 15 MG capsule Take 15 mg by mouth daily at 12 noon.   Past Month at Unknown time  . predniSONE (STERAPRED UNI-PAK) 5 MG TABS tablet Take 5 mg by mouth daily. Dose pack. Finished on 01-26-15   01/26/2015 at Unknown time    Physical exam Blood pressure 99/54, pulse 68, temperature 98 F (36.7 C), temperature source Oral, resp. rate 18, height 5\' 2"  (1.575 m), weight 138 lb 12.8 oz (62.959 kg), SpO2 99 %. General: NAD Neck: No JVD, no thyromegaly or thyroid nodule.  Lungs: Clear to auscultation bilaterally with normal respiratory effort. CV: Nondisplaced PMI.  Heart regular S1/S2, no S3/S4, no murmur.  No peripheral edema.  No carotid bruit.  Normal pedal pulses.  Abdomen: Soft, nontender, no hepatosplenomegaly, no distention.  Skin: Intact without lesions or rashes.  Neurologic: Alert and oriented x 3.  Psych: Normal affect. Extremities: No clubbing or cyanosis.  HEENT: Normal.   Labs:  Lab Results  Component Value Date   WBC 8.2 01/27/2015   HGB 14.2 01/27/2015   HCT 41.8 01/27/2015   MCV 89.9 01/27/2015   PLT 303 01/27/2015    Recent Labs Lab 01/27/15 1330  NA 137  K 4.2  CL 103  CO2 29  BUN 22  CREATININE 0.78  CALCIUM 9.9  GLUCOSE 87   Lab Results  Component Value Date   TROPONINI <0.03 01/28/2015    Radiology: - CXR: No active disease  EKG: NSR, very slight inferior ST elevation similar to prior ECG (prior ECG apparently still in ER)  ASSESSMENT AND PLAN: 64 yo with history of GERD presented yesterday with atypical chest pain x 45 minutes that resolved with aspirin.  No further chest pain.  ECG same as prior and cardiac enzymes negative x 3.   CXR clear.  Possible esophageal spasm or stress reaction, also could be GERD.  Feels fine this morning.  She should take ASA 81 mg daily.  Continue PPI.  I will arrange for outpatient Cardiolite to be done Thursday or Friday.  She can go home today.   Loralie Champagne 01/28/2015 8:42 AM

## 2015-01-29 ENCOUNTER — Telehealth (HOSPITAL_COMMUNITY): Payer: Self-pay

## 2015-01-29 NOTE — Telephone Encounter (Signed)
Encounter complete. 

## 2015-01-30 ENCOUNTER — Ambulatory Visit (HOSPITAL_COMMUNITY)
Admission: RE | Admit: 2015-01-30 | Discharge: 2015-01-30 | Disposition: A | Discharge status: Home / Self Care | Payer: Medicare Other | Source: Ambulatory Visit | Attending: Cardiology | Admitting: Cardiology

## 2015-01-30 DIAGNOSIS — R5383 Other fatigue: Secondary | ICD-10-CM | POA: Diagnosis not present

## 2015-01-30 DIAGNOSIS — R42 Dizziness and giddiness: Secondary | ICD-10-CM | POA: Diagnosis not present

## 2015-01-30 DIAGNOSIS — R61 Generalized hyperhidrosis: Secondary | ICD-10-CM | POA: Diagnosis not present

## 2015-01-30 DIAGNOSIS — Z8249 Family history of ischemic heart disease and other diseases of the circulatory system: Secondary | ICD-10-CM | POA: Insufficient documentation

## 2015-01-30 DIAGNOSIS — R079 Chest pain, unspecified: Secondary | ICD-10-CM | POA: Diagnosis present

## 2015-01-30 MED ORDER — TECHNETIUM TC 99M SESTAMIBI GENERIC - CARDIOLITE
30.1000 | Freq: Once | INTRAVENOUS | Status: AC | PRN
  Administered 2015-01-30: 30.1 via INTRAVENOUS

## 2015-01-30 MED ORDER — TECHNETIUM TC 99M SESTAMIBI GENERIC - CARDIOLITE
10.5000 | Freq: Once | INTRAVENOUS | Status: AC | PRN
  Administered 2015-01-30: 11 via INTRAVENOUS

## 2015-01-30 NOTE — Procedures (Addendum)
[place] [address] [place] [address] [phone]  Cardiology Nuclear Med Study  Jenny Henderson is a 64 y.o. female     MRN : [id number]     DOB: 01-20-1951  Procedure Date: 01/30/2015  Nuclear Med Background Indication for Stress Test:  Evaluation for Ischemia and [facility] History:  No prior cardiac or respiratory history reported;No prior NUC MPI for comparison. Cardiac Risk Factors: Family History - CAD  Symptoms:  Chest Pain, Diaphoresis, Dizziness, Fatigue and Light-Headedness   Nuclear Pre-Procedure Caffeine/Decaff Intake:  9:30pm NPO After: 5:30am   IV Site: R Forearm  IV 0.9% NS with Angio Cath:  22g  Chest Size (in):  n/a IV Started by: Rolene Course, RN  Height: 5\' 2"  (1.575 m)  Cup Size: C  BMI:  Body mass index is 25.23 kg/(m^2). Weight:  138 lb (62.596 kg)   Tech Comments:  n/a    Nuclear Med Study 1 or 2 day study: 1 day  Stress Test Type:  Stress  Order Authorizing Provider:  Theador Hawthorne   Resting Radionuclide: Technetium 38m Sestamibi  Resting Radionuclide Dose: 10.5 mCi   Stress Radionuclide:  Technetium 49m Sestamibi  Stress Radionuclide Dose: 30.1 mCi           Stress Protocol Rest HR: 63 Stress HR: 151  Rest BP: 118/73 Stress BP: 181/91  Exercise Time (min): 10:46 METS: 13.00   Predicted Max HR: 155 bpm % Max HR: 97.42 bpm Rate Pressure Product: 27331  Dose of Adenosine (mg):  n/a Dose of Lexiscan: n/a mg  Dose of Atropine (mg): n/a Dose of Dobutamine: n/a mcg/kg/min (at max HR)  Stress Test Technologist: Mellody Memos, CCT Nuclear Technologist: Imagene Riches, CNMT   Rest Procedure:  Myocardial perfusion imaging was performed at rest 45 minutes following the intravenous administration of Technetium 69m Sestamibi. Stress Procedure:  The patient performed treadmill exercise using a Bruce  Protocol for 10 minutes 46 seconds. The patient stopped due to generalized fatigue.  Patient denied any chest pain.  There were no significant ST-T wave changes.  Technetium 71m Sestamibi was injected IV at peak exercise and myocardial perfusion imaging was performed after a brief delay.  Transient Ischemic Dilatation (Normal <1.22):  1.0  QGS EDV: 85 ml QGS ESV:  34 ml LV Ejection Fraction: 60%  Rest ECG: NSR - Normal EKG  Stress ECG: No significant change from baseline ECG  QPS Raw Data Images:  Normal; no motion artifact; normal heart/lung ratio. Stress Images:  Mild apical attenuation Rest Images:  Mild apical attenuation Subtraction (SDS):  0  Impression Exercise Capacity:  Excellent exercise capacity. BP Response:  Normal blood pressure response. Clinical Symptoms:  No significant symptoms noted. ECG Impression:  No significant ST segment change suggestive of ischemia. Comparison with Prior Nuclear Study: No previous nuclear study performed  Overall Impression:  Low risk stress nuclear study without reversible ischemia.  LV Wall Motion:  NL LV Function; NL Wall Motion; EF 60%  Pixie Casino, MD, [facility] Board Certified in Nuclear Cardiology Attending Cardiologist [place], MD  01/30/2015 12:24 PM

## 2015-02-04 ENCOUNTER — Ambulatory Visit (INDEPENDENT_AMBULATORY_CARE_PROVIDER_SITE_OTHER): Payer: BLUE CROSS/BLUE SHIELD | Admitting: Physician Assistant

## 2015-02-04 ENCOUNTER — Encounter: Payer: Self-pay | Admitting: Physician Assistant

## 2015-02-04 VITALS — BP 110/72 | HR 64 | Ht 62.0 in | Wt 141.0 lb

## 2015-02-04 DIAGNOSIS — R079 Chest pain, unspecified: Secondary | ICD-10-CM | POA: Diagnosis not present

## 2015-02-04 DIAGNOSIS — K219 Gastro-esophageal reflux disease without esophagitis: Secondary | ICD-10-CM

## 2015-02-04 NOTE — Patient Instructions (Signed)
Your physician recommends that you schedule a follow-up appointment in: WITH DR. Aundra Dubin AS NEEDED  Your physician recommends that you continue on your current medications as directed. Please refer to the Current Medication list given to you today.

## 2015-02-04 NOTE — Progress Notes (Signed)
Cardiology Office Note   Date:  02/04/2015   ID:  Jenny Henderson, DOB 1951-08-06, MRN [id number]  PCP:  Tula Nakayama  Cardiologist:  Dr. Loralie Champagne     Chief Complaint  Patient presents with  . Hospitalization Follow-up    Admitted 01/28/15 for Chest Pain     History of Present Illness: Jenny Henderson is a 64 y.o. female with a hx of GERD.  She was evaluated by Dr. Loralie Champagne on 01/28/15 in the hospital for chest pain.  Symptoms lasted 45 minutes and resolved with ASA.  CEs were neg x 3.  Symptoms were thought to be atypical for ischemia.  OP nuclear study was arranged.  This was done 01/30/15. She exercised for over 10 minutes without ECG changes.  Nuclear images demonstrated no ischemia and normal LVF.    She returns for FU.  She has been doing well since discharge. She denies any further chest pain. She did have an episode of interscapular pain last week. She denies exertional symptoms. She walked on the treadmill last night without chest pain or shortness of breath. She denies orthopnea, PND or edema. She denies syncope. Of note, she finished a week of prednisone taper prior to her episode of chest discomfort.  Studies/Reports Reviewed Today:  Nuclear 01/30/15 Overall Impression: Low risk stress nuclear study without reversible ischemia.  LV Wall Motion: NL LV Function; NL Wall Motion; EF 60%  Carotid US 02/2013 IMPRESSION:  Minimal plaque right carotid bulb.  Tortuous and deep distal ICAs bilaterally.  No evidence of hemodynamically significant stenosis.   Past Medical History  Diagnosis Date  . GERD (gastroesophageal reflux disease)   . Chest pain 01/27/2015    ETT-Myoview 4/16:  no ischemia, EF 60%    Past Surgical History  Procedure Laterality Date  . Cesarean section    . Otoplasty      bilat, OD x 2  . Cataract extraction      left eye     Current Outpatient Prescriptions  Medication Sig Dispense Refill  . aspirin EC 81 MG EC tablet Take 1 tablet  (81 mg total) by mouth daily.    . fluticasone (FLONASE) 50 MCG/ACT nasal spray Place 1 spray into both nostrils daily.    . lansoprazole (PREVACID) 15 MG capsule Take 15 mg by mouth daily at 12 noon.     No current facility-administered medications for this visit.    Allergies:   Codeine    Social History:  The patient  reports that she has never smoked. She has never used smokeless tobacco. She reports that she drinks about 0.6 oz of alcohol per week. She reports that she does not use illicit drugs.   Family History:  The patient's family history includes Alzheimer's disease in her mother; CVA (age of onset: 33) in her mother; Colon cancer in her paternal grandmother.    ROS:   Please see the history of present illness.   Review of Systems  Eyes: Positive for visual disturbance.  Cardiovascular: Positive for chest pain.    PHYSICAL EXAM: VS:  BP 110/72 mmHg  Pulse 64  Ht 5\' 2"  (1.575 m)  Wt 141 lb (63.957 kg)  BMI 25.78 kg/m2    Wt Readings from Last 3 Encounters:  02/04/15 141 lb (63.957 kg)  01/30/15 138 lb (62.596 kg)  01/28/15 138 lb 12.8 oz (62.959 kg)     GEN: Well nourished, well developed, in no acute distress HEENT: normal Neck: no JVD,  no carotid bruits, no masses Cardiac:  Normal S1/S2, RRR; no murmur ,  no rubs or gallops, no edema  Respiratory:  clear to auscultation bilaterally, no wheezing, rhonchi or rales. GI: soft, nontender, nondistended, + BS MS: no deformity or atrophy Skin: warm and dry  Neuro:  CNs II-XII intact, Strength and sensation are intact Psych: Normal affect   EKG:  EKG is not ordered today.  It demonstrates:   n/a   Recent Labs: 01/27/2015: BUN 22; Creatinine 0.78; Hemoglobin 14.2; Platelets 303; Potassium 4.2; Sodium 137    Lipid Panel No results found for: CHOL, TRIG, HDL, CHOLHDL, VLDL, LDLCALC, LDLDIRECT    ASSESSMENT AND PLAN:  Chest pain, unspecified chest pain type No recurrence.  Recent nuclear stress test low  risk.  No further cardiac workup needed.  Suspect GI related chest pain.  It may be worthwhile to FU with her PCP and consider RUQ ultrasound to r/o GB disease.    Gastroesophageal reflux disease, esophagitis presence not specified  Recent prednisone taper may have contributed to gastritis.  Continue PPI.  Current medicines are reviewed at length with the patient today.  The patient does not have concerns regarding medicines.  The following changes have been made:  no change  Labs/ tests ordered today include:  No orders of the defined types were placed in this encounter.    Disposition:   FU with Dr. Loralie Champagne as needed.     Signed, Versie Starks, MHS 02/04/2015 10:40 AM    [facility] [place], [place], [place]  [address] Phone: [phone]; Fax: [phone]

## 2015-03-31 ENCOUNTER — Other Ambulatory Visit: Payer: Self-pay | Admitting: Family Medicine

## 2015-03-31 DIAGNOSIS — R110 Nausea: Secondary | ICD-10-CM

## 2015-03-31 DIAGNOSIS — R1011 Right upper quadrant pain: Secondary | ICD-10-CM

## 2015-04-03 ENCOUNTER — Ambulatory Visit
Admission: RE | Admit: 2015-04-03 | Discharge: 2015-04-03 | Disposition: A | Discharge status: Home / Self Care | Payer: BLUE CROSS/BLUE SHIELD | Source: Ambulatory Visit | Attending: Family Medicine | Admitting: Family Medicine

## 2015-04-03 DIAGNOSIS — R1011 Right upper quadrant pain: Secondary | ICD-10-CM

## 2015-04-03 DIAGNOSIS — R110 Nausea: Secondary | ICD-10-CM

## 2015-04-13 ENCOUNTER — Other Ambulatory Visit: Payer: Self-pay | Admitting: General Surgery

## 2015-05-05 ENCOUNTER — Other Ambulatory Visit (HOSPITAL_COMMUNITY): Payer: BLUE CROSS/BLUE SHIELD

## 2015-05-05 NOTE — Pre-Procedure Instructions (Addendum)
Jenny Henderson  05/05/2015      Your procedure is scheduled on  Wednesday,July 13.  Report to [facility] Admitting at 11:50 A.M.   Call this number if you have problems the morning of surgery:[phone]               For any other questions, please call [phone], Monday - Friday 8 AM - 4 PM.   Remember:  Do not eat food or drink liquids after midnight Tuesday, June 12  Take these medicines the morning of surgery with A SIP OF WATER : lansoprazole (PREVACID).  STOP all herbel meds, nsaids (aleve,naproxen,advil,ibuprofen) 5 days prior to surgery starting tomorrow including vitamins ,aspirin   Do not wear jewelry, make-up or nail polish.  Do not wear lotions, powders, or perfumes.    Do not shave 48 hours prior to surgery.    Do not bring valuables to the hospital.  [facility] is not responsible for any belongings or valuables.  Contacts, dentures or bridgework may not be worn into surgery.  Leave your suitcase in the car.  After surgery it may be brought to your room.  For patients admitted to the hospital, discharge time will be determined by your treatment team.  Patients discharged the day of surgery will not be allowed to drive home.   Name and phone number of your driver:   =-  Special instructions:  - Special Instructions: [place] - Preparing for Surgery  Before surgery, you can play an important role.  Because skin is not sterile, your skin needs to be as free of germs as possible.  You can reduce the number of germs on you skin by washing with CHG (chlorahexidine gluconate) soap before surgery.  CHG is an antiseptic cleaner which kills germs and bonds with the skin to continue killing germs even after washing.  Please DO NOT use if you have an allergy to CHG or antibacterial soaps.  If your skin becomes reddened/irritated stop using the CHG and inform your nurse when you arrive at Short Stay.  Do not shave (including legs and underarms) for at  least 48 hours prior to the first CHG shower.  You may shave your face.  Please follow these instructions carefully:   1.  Shower with CHG Soap the night before surgery and the morning of Surgery.  2.  If you choose to wash your hair, wash your hair first as usual with your normal shampoo.  3.  After you shampoo, rinse your hair and body thoroughly to remove the Shampoo.  4.  Use CHG as you would any other liquid soap.  You can apply chg directly  to the skin and wash gently with scrungie or a clean washcloth.  5.  Apply the CHG Soap to your body ONLY FROM THE NECK DOWN.  Do not use on open wounds or open sores.  Avoid contact with your eyes ears, mouth and genitals (private parts).  Wash genitals (private parts)       with your normal soap.  6.  Wash thoroughly, paying special attention to the area where your surgery will be performed.  7.  Thoroughly rinse your body with warm water from the neck down.  8.  DO NOT shower/wash with your normal soap after using and rinsing off the CHG Soap.  9.  Pat yourself dry with a clean towel.            10.  Wear clean pajamas.  11.  Place clean sheets on your bed the night of your first shower and do not sleep with pets.  Day of Surgery  Do not apply any lotions/deodorants the morning of surgery.  Please wear clean clothes to the hospital/surgery center.  Please read over the following fact sheets that you were given. Pain Booklet, Coughing and Deep Breathing and Surgical Site Infection Prevention

## 2015-05-06 ENCOUNTER — Encounter (HOSPITAL_COMMUNITY)
Admission: RE | Admit: 2015-05-06 | Discharge: 2015-05-06 | Disposition: A | Discharge status: Home / Self Care | Payer: BLUE CROSS/BLUE SHIELD | Source: Ambulatory Visit | Attending: General Surgery | Admitting: General Surgery

## 2015-05-06 ENCOUNTER — Encounter (HOSPITAL_COMMUNITY): Payer: Self-pay

## 2015-05-06 DIAGNOSIS — Z01812 Encounter for preprocedural laboratory examination: Secondary | ICD-10-CM | POA: Diagnosis present

## 2015-05-06 DIAGNOSIS — K8080 Other cholelithiasis without obstruction: Secondary | ICD-10-CM | POA: Diagnosis not present

## 2015-05-06 HISTORY — DX: Personal history of urinary calculi: Z87.442

## 2015-05-06 HISTORY — DX: Other specified postprocedural states: Z98.890

## 2015-05-06 HISTORY — DX: Nausea with vomiting, unspecified: R11.2

## 2015-05-06 HISTORY — DX: Angina pectoris, unspecified: I20.9

## 2015-05-06 LAB — CBC
HCT: 43.4 % (ref 36.0–46.0)
HEMOGLOBIN: 14.3 g/dL (ref 12.0–15.0)
MCH: 29.8 pg (ref 26.0–34.0)
MCHC: 32.9 g/dL (ref 30.0–36.0)
MCV: 90.4 fL (ref 78.0–100.0)
Platelets: 291 10*3/uL (ref 150–400)
RBC: 4.8 MIL/uL (ref 3.87–5.11)
RDW: 12.1 % (ref 11.5–15.5)
WBC: 5 10*3/uL (ref 4.0–10.5)

## 2015-05-06 LAB — BASIC METABOLIC PANEL
Anion gap: 7 (ref 5–15)
BUN: 16 mg/dL (ref 6–20)
CALCIUM: 9.8 mg/dL (ref 8.9–10.3)
CO2: 28 mmol/L (ref 22–32)
CREATININE: 0.78 mg/dL (ref 0.44–1.00)
Chloride: 106 mmol/L (ref 101–111)
GFR calc Af Amer: >60 mL/min (ref >60)
Glucose, Bld: 89 mg/dL (ref 65–99)
Potassium: 4 mmol/L (ref 3.5–5.1)
SODIUM: 141 mmol/L (ref 135–145)

## 2015-05-06 NOTE — Progress Notes (Signed)
Anesthesia Chart Review: 64 year old female scheduled for cholecystectomy 05/13/15 by Dr. Marlou Starks.  History includes post-operative N/V, GERD, nephrolithiasis.  Evaluation for chest pain 12/2014 with negative cardiac enzymes and non-ischemic stress test. PCP is listed as Bing Matter, PA-C. Seen by cardiologist Dr. Aundra Dubin for chest pain 12/2014 with 02/04/15 follow-up with Richardson Dopp, PA-C. No further cardiac work-up recommended.  She was referred back to her PCP for consideration of GI etiology (gall bladder disease).  01/28/15 EKG: NSR.Early repolarization abnormality, particularly in inferolateral leads.  01/30/15 Nuclear stress test: Overall Impression: Low risk stress nuclear study without reversible ischemia. LV Wall Motion: NL LV Function; NL Wall Motion; EF 60%.    03/01/13 Carotid duplex: IMPRESSION: Minimal plaque right carotid bulb. Tortuous and deep distal ICAs bilaterally. No evidence of hemodynamically significant stenosis.  01/27/15 CXR: No edema or consolidation.  Preoperative labs noted.  Anticipate that she can proceed as planned.   [facility] Short Stay Center/Anesthesiology Phone [phone] 05/06/2015 12:35 PM

## 2015-05-11 ENCOUNTER — Encounter (HOSPITAL_COMMUNITY): Payer: Self-pay | Admitting: *Deleted

## 2015-05-11 NOTE — Progress Notes (Signed)
Pt called today stating that she had forgotten to tell the PAT nurse that she was hypoglycemic and she is concerned since her surgery is not scheduled to start to 1:45 PM on Wednesday. I asked her if she usually feels her blood sugar getting low and she stated yes, she feels like a dish rag. I instructed her to call the Short Stay dept the morning of surgery if she gets to feeling that way and they will instruct her what she can do at home or if she needs to come in earlier. She voiced understanding. Add hypoglycemia to her medical history in EPIC.

## 2015-05-12 MED ORDER — CEFAZOLIN SODIUM-DEXTROSE 2-3 GM-% IV SOLR
2.0000 g | INTRAVENOUS | Status: AC
  Administered 2015-05-13: 2 g via INTRAVENOUS
  Filled 2015-05-12: qty 50

## 2015-05-13 ENCOUNTER — Ambulatory Visit (HOSPITAL_COMMUNITY): Payer: BLUE CROSS/BLUE SHIELD | Admitting: Certified Registered Nurse Anesthetist

## 2015-05-13 ENCOUNTER — Ambulatory Visit (HOSPITAL_COMMUNITY): Payer: BLUE CROSS/BLUE SHIELD | Admitting: Vascular Surgery

## 2015-05-13 ENCOUNTER — Ambulatory Visit (HOSPITAL_COMMUNITY)
Admission: RE | Admit: 2015-05-13 | Discharge: 2015-05-13 | Disposition: A | Discharge status: Home / Self Care | Payer: BLUE CROSS/BLUE SHIELD | Source: Ambulatory Visit | Attending: General Surgery | Admitting: General Surgery

## 2015-05-13 ENCOUNTER — Ambulatory Visit (HOSPITAL_COMMUNITY): Payer: BLUE CROSS/BLUE SHIELD

## 2015-05-13 ENCOUNTER — Encounter (HOSPITAL_COMMUNITY): Payer: Self-pay | Admitting: *Deleted

## 2015-05-13 ENCOUNTER — Encounter (HOSPITAL_COMMUNITY)
Admission: RE | Disposition: A | Discharge status: Home / Self Care | Payer: Self-pay | Source: Ambulatory Visit | Attending: General Surgery

## 2015-05-13 DIAGNOSIS — K8010 Calculus of gallbladder with chronic cholecystitis without obstruction: Secondary | ICD-10-CM | POA: Diagnosis not present

## 2015-05-13 DIAGNOSIS — K219 Gastro-esophageal reflux disease without esophagitis: Secondary | ICD-10-CM | POA: Diagnosis not present

## 2015-05-13 DIAGNOSIS — K8020 Calculus of gallbladder without cholecystitis without obstruction: Secondary | ICD-10-CM | POA: Diagnosis present

## 2015-05-13 HISTORY — DX: Hypoglycemia, unspecified: E16.2

## 2015-05-13 HISTORY — PX: CHOLECYSTECTOMY: SHX55

## 2015-05-13 LAB — GLUCOSE, CAPILLARY: Glucose-Capillary: 127 mg/dL — ABNORMAL HIGH (ref 65–99)

## 2015-05-13 SURGERY — LAPAROSCOPIC CHOLECYSTECTOMY WITH INTRAOPERATIVE CHOLANGIOGRAM
Anesthesia: General | Site: Abdomen

## 2015-05-13 MED ORDER — SUCCINYLCHOLINE CHLORIDE 20 MG/ML IJ SOLN
INTRAMUSCULAR | Status: DC | PRN
  Administered 2015-05-13: 70 mg via INTRAVENOUS

## 2015-05-13 MED ORDER — GLYCOPYRROLATE 0.2 MG/ML IJ SOLN
INTRAMUSCULAR | Status: DC | PRN
  Administered 2015-05-13: .2 mg via INTRAVENOUS
  Administered 2015-05-13: .4 mg via INTRAVENOUS

## 2015-05-13 MED ORDER — DEXAMETHASONE SODIUM PHOSPHATE 4 MG/ML IJ SOLN
INTRAMUSCULAR | Status: AC
  Filled 2015-05-13: qty 2

## 2015-05-13 MED ORDER — ONDANSETRON HCL 4 MG/2ML IJ SOLN
INTRAMUSCULAR | Status: DC | PRN
  Administered 2015-05-13 (×2): 4 mg via INTRAVENOUS

## 2015-05-13 MED ORDER — NEOSTIGMINE METHYLSULFATE 10 MG/10ML IV SOLN
INTRAVENOUS | Status: AC
  Filled 2015-05-13: qty 1

## 2015-05-13 MED ORDER — FENTANYL CITRATE (PF) 100 MCG/2ML IJ SOLN
INTRAMUSCULAR | Status: DC | PRN
  Administered 2015-05-13 (×2): 50 ug via INTRAVENOUS
  Administered 2015-05-13: 75 ug via INTRAVENOUS
  Administered 2015-05-13: 25 ug via INTRAVENOUS
  Administered 2015-05-13 (×2): 50 ug via INTRAVENOUS

## 2015-05-13 MED ORDER — OXYCODONE HCL 5 MG PO TABS
ORAL_TABLET | ORAL | Status: AC
  Filled 2015-05-13: qty 1

## 2015-05-13 MED ORDER — BUPIVACAINE-EPINEPHRINE 0.25% -1:200000 IJ SOLN
INTRAMUSCULAR | Status: DC | PRN
  Administered 2015-05-13: 24 mL

## 2015-05-13 MED ORDER — SCOPOLAMINE 1 MG/3DAYS TD PT72
MEDICATED_PATCH | TRANSDERMAL | Status: DC | PRN
  Administered 2015-05-13: 1 via TRANSDERMAL

## 2015-05-13 MED ORDER — ROCURONIUM BROMIDE 50 MG/5ML IV SOLN
INTRAVENOUS | Status: AC
  Filled 2015-05-13: qty 1

## 2015-05-13 MED ORDER — FENTANYL CITRATE (PF) 250 MCG/5ML IJ SOLN
INTRAMUSCULAR | Status: AC
  Filled 2015-05-13: qty 5

## 2015-05-13 MED ORDER — OXYCODONE HCL 5 MG PO TABS
5.0000 mg | ORAL_TABLET | Freq: Once | ORAL | Status: AC
  Administered 2015-05-13: 5 mg via ORAL

## 2015-05-13 MED ORDER — GLYCOPYRROLATE 0.2 MG/ML IJ SOLN
INTRAMUSCULAR | Status: AC
  Filled 2015-05-13: qty 2

## 2015-05-13 MED ORDER — PROPOFOL 10 MG/ML IV BOLUS
INTRAVENOUS | Status: AC
  Filled 2015-05-13: qty 20

## 2015-05-13 MED ORDER — OXYCODONE-ACETAMINOPHEN 5-325 MG PO TABS: 1.0000 | ORAL_TABLET | ORAL | Status: DC | PRN

## 2015-05-13 MED ORDER — GLYCOPYRROLATE 0.2 MG/ML IJ SOLN
INTRAMUSCULAR | Status: AC
  Filled 2015-05-13: qty 3

## 2015-05-13 MED ORDER — PROPOFOL 10 MG/ML IV BOLUS
INTRAVENOUS | Status: DC | PRN
  Administered 2015-05-13: 180 mg via INTRAVENOUS

## 2015-05-13 MED ORDER — PHENYLEPHRINE 40 MCG/ML (10ML) SYRINGE FOR IV PUSH (FOR BLOOD PRESSURE SUPPORT)
PREFILLED_SYRINGE | INTRAVENOUS | Status: AC
  Filled 2015-05-13: qty 20

## 2015-05-13 MED ORDER — 0.9 % SODIUM CHLORIDE (POUR BTL) OPTIME
TOPICAL | Status: DC | PRN
  Administered 2015-05-13: 1000 mL

## 2015-05-13 MED ORDER — SUCCINYLCHOLINE CHLORIDE 20 MG/ML IJ SOLN
INTRAMUSCULAR | Status: AC
Start: 2015-05-13 — End: 2015-05-13
  Filled 2015-05-13: qty 1

## 2015-05-13 MED ORDER — LIDOCAINE HCL 4 % MT SOLN
OROMUCOSAL | Status: DC | PRN
  Administered 2015-05-13: 4 mL via TOPICAL

## 2015-05-13 MED ORDER — PHENYLEPHRINE HCL 10 MG/ML IJ SOLN
INTRAMUSCULAR | Status: DC | PRN
  Administered 2015-05-13: 80 ug via INTRAVENOUS
  Administered 2015-05-13 (×2): 40 ug via INTRAVENOUS
  Administered 2015-05-13 (×4): 80 ug via INTRAVENOUS

## 2015-05-13 MED ORDER — SODIUM CHLORIDE 0.9 % IV SOLN
INTRAVENOUS | Status: DC | PRN
  Administered 2015-05-13: 7 mL

## 2015-05-13 MED ORDER — SCOPOLAMINE 1 MG/3DAYS TD PT72
MEDICATED_PATCH | TRANSDERMAL | Status: AC
  Filled 2015-05-13: qty 1

## 2015-05-13 MED ORDER — ROCURONIUM BROMIDE 100 MG/10ML IV SOLN
INTRAVENOUS | Status: DC | PRN
  Administered 2015-05-13: 20 mg via INTRAVENOUS
  Administered 2015-05-13: 10 mg via INTRAVENOUS

## 2015-05-13 MED ORDER — CHLORHEXIDINE GLUCONATE 4 % EX LIQD: 1.0000 "application " | Freq: Once | CUTANEOUS | Status: DC

## 2015-05-13 MED ORDER — PHENYLEPHRINE HCL 10 MG/ML IJ SOLN
10.0000 mg | INTRAVENOUS | Status: DC | PRN
  Administered 2015-05-13: 20 ug/min via INTRAVENOUS

## 2015-05-13 MED ORDER — SODIUM CHLORIDE 0.9 % IR SOLN
Status: DC | PRN
  Administered 2015-05-13: 1000 mL

## 2015-05-13 MED ORDER — LACTATED RINGERS IV SOLN
INTRAVENOUS | Status: DC
  Administered 2015-05-13 (×2): via INTRAVENOUS

## 2015-05-13 MED ORDER — NEOSTIGMINE METHYLSULFATE 10 MG/10ML IV SOLN
INTRAVENOUS | Status: DC | PRN
  Administered 2015-05-13: 3 mg via INTRAVENOUS

## 2015-05-13 MED ORDER — HYDROMORPHONE HCL 1 MG/ML IJ SOLN: 0.2500 mg | INTRAMUSCULAR | Status: DC | PRN

## 2015-05-13 SURGICAL SUPPLY — 36 items
APPLIER CLIP 5 13 M/L LIGAMAX5 (MISCELLANEOUS) ×3
BAG SPEC RTRVL LRG 6X4 10 (ENDOMECHANICALS) ×1
BLADE SURG ROTATE 9660 (MISCELLANEOUS) IMPLANT
CANISTER SUCTION 2500CC (MISCELLANEOUS) ×3 IMPLANT
CATH REDDICK CHOLANGI 4FR 50CM (CATHETERS) ×3 IMPLANT
CHLORAPREP W/TINT 26ML (MISCELLANEOUS) ×3 IMPLANT
CLIP APPLIE 5 13 M/L LIGAMAX5 (MISCELLANEOUS) ×1 IMPLANT
COVER MAYO STAND STRL (DRAPES) ×3 IMPLANT
COVER SURGICAL LIGHT HANDLE (MISCELLANEOUS) ×3 IMPLANT
DRAPE C-ARM 42X72 X-RAY (DRAPES) ×3 IMPLANT
ELECT REM PT RETURN 9FT ADLT (ELECTROSURGICAL) ×3
ELECTRODE REM PT RTRN 9FT ADLT (ELECTROSURGICAL) ×1 IMPLANT
GLOVE BIO SURGEON STRL SZ7.5 (GLOVE) ×3 IMPLANT
GLOVE BIOGEL PI ORTHO PRO SZ7 (GLOVE) ×2
GLOVE PI ORTHO PRO STRL SZ7 (GLOVE) ×1 IMPLANT
GLOVE SURG SS PI 6.5 STRL IVOR (GLOVE) ×3 IMPLANT
GOWN STRL REUS W/ TWL LRG LVL3 (GOWN DISPOSABLE) ×3 IMPLANT
GOWN STRL REUS W/TWL LRG LVL3 (GOWN DISPOSABLE) ×9
IV CATH 14GX2 1/4 (CATHETERS) ×3 IMPLANT
KIT BASIN OR (CUSTOM PROCEDURE TRAY) ×3 IMPLANT
KIT ROOM TURNOVER OR (KITS) ×3 IMPLANT
LIQUID BAND (GAUZE/BANDAGES/DRESSINGS) ×3 IMPLANT
NS IRRIG 1000ML POUR BTL (IV SOLUTION) ×3 IMPLANT
PAD ARMBOARD 7.5X6 YLW CONV (MISCELLANEOUS) ×3 IMPLANT
POUCH SPECIMEN RETRIEVAL 10MM (ENDOMECHANICALS) ×3 IMPLANT
SCISSORS LAP 5X35 DISP (ENDOMECHANICALS) ×3 IMPLANT
SET IRRIG TUBING LAPAROSCOPIC (IRRIGATION / IRRIGATOR) ×3 IMPLANT
SLEEVE ENDOPATH XCEL 5M (ENDOMECHANICALS) ×6 IMPLANT
SPECIMEN JAR SMALL (MISCELLANEOUS) ×3 IMPLANT
SUT MNCRL AB 4-0 PS2 18 (SUTURE) ×3 IMPLANT
TOWEL OR 17X24 6PK STRL BLUE (TOWEL DISPOSABLE) ×3 IMPLANT
TOWEL OR 17X26 10 PK STRL BLUE (TOWEL DISPOSABLE) ×3 IMPLANT
TRAY LAPAROSCOPIC MC (CUSTOM PROCEDURE TRAY) ×3 IMPLANT
TROCAR XCEL BLUNT TIP 100MML (ENDOMECHANICALS) ×3 IMPLANT
TROCAR XCEL NON-BLD 5MMX100MML (ENDOMECHANICALS) ×3 IMPLANT
TUBING INSUFFLATION (TUBING) ×3 IMPLANT

## 2015-05-13 NOTE — Interval H&P Note (Signed)
History and Physical Interval Note:  05/13/2015 8:24 AM  Jenny Henderson  has presented today for surgery, with the diagnosis of Gallstones  The various methods of treatment have been discussed with the patient and family. After consideration of risks, benefits and other options for treatment, the patient has consented to  Procedure(s): LAPAROSCOPIC CHOLECYSTECTOMY WITH INTRAOPERATIVE CHOLANGIOGRAM (N/A) as a surgical intervention .  The patient's history has been reviewed, patient examined, no change in status, stable for surgery.  I have reviewed the patient's chart and labs.  Questions were answered to the patient's satisfaction.     TOTH III,PAUL S

## 2015-05-13 NOTE — Op Note (Signed)
05/13/2015  2:18 PM  PATIENT:  Jenny Henderson  64 y.o. female  PRE-OPERATIVE DIAGNOSIS:  Gallstones  POST-OPERATIVE DIAGNOSIS:  Gallstones  PROCEDURE:  Procedure(s): LAPAROSCOPIC CHOLECYSTECTOMY WITH INTRAOPERATIVE CHOLANGIOGRAM (N/A)  SURGEON:  Surgeon(s) and Role:    * Jovita Kussmaul, MD - Primary  PHYSICIAN ASSISTANT:   ASSISTANTS: Sharyn Dross, RNFA   ANESTHESIA:   general  EBL:  Total I/O In: 1000 [I.V.:1000] Out: 20 [Blood:20]  BLOOD ADMINISTERED:none  DRAINS: none   LOCAL MEDICATIONS USED:  MARCAINE     SPECIMEN:  Source of Specimen:  gallbladder  DISPOSITION OF SPECIMEN:  PATHOLOGY  COUNTS:  YES  TOURNIQUET:  * No tourniquets in log *  DICTATION: .Dragon Dictation   Procedure: After informed consent was obtained the patient was brought to the operating room and placed in the supine position on the operating room table. After adequate induction of general anesthesia the patient's abdomen was prepped with ChloraPrep allowed to dry and draped in usual sterile manner. The area below the umbilicus was infiltrated with quarter percent  Marcaine. A small incision was made with a 15 blade knife. The incision was carried down through the subcutaneous tissue bluntly with a hemostat and [employer] retractors. The linea alba was identified. The linea alba was incised with a 15 blade knife and each side was grasped with Coker clamps. The preperitoneal space was then probed with a hemostat until the peritoneum was opened and access was gained to the abdominal cavity. A 0 Vicryl pursestring stitch was placed in the fascia surrounding the opening. A Hassan cannula was then placed through the opening and anchored in place with the previously placed Vicryl purse string stitch. The abdomen was insufflated with carbon dioxide without difficulty. A laparoscope was inserted through the [facility] cannula in the right upper quadrant was inspected. Next the epigastric region was infiltrated with  % Marcaine. A small incision was made with a 15 blade knife. A 5 mm port was placed bluntly through this incision into the abdominal cavity under direct vision. Next 2 sites were chosen laterally on the right side of the abdomen for placement of 5 mm ports. Each of these areas was infiltrated with quarter percent Marcaine. Small stab incisions were made with a 15 blade knife. 5 mm ports were then placed bluntly through these incisions into the abdominal cavity under direct vision without difficulty. A blunt grasper was placed through the lateralmost 5 mm port and used to grasp the dome of the gallbladder and elevated anteriorly and superiorly. Another blunt grasper was placed through the other 5 mm port and used to retract the body and neck of the gallbladder. A dissector was placed through the epigastric port and using the electrocautery the peritoneal reflection at the gallbladder neck was opened. Blunt dissection was then carried out in this area until the gallbladder neck-cystic duct junction was readily identified and a good window was created. A single clip was placed on the gallbladder neck. A small  ductotomy was made just below the clip with laparoscopic scissors. A 14-gauge Angiocath was then placed through the anterior abdominal wall under direct vision. A Reddick cholangiogram catheter was then placed through the Angiocath and flushed. The catheter was then placed in the cystic duct and anchored in place with a clip. A cholangiogram was obtained that showed no filling defects good emptying into the duodenum an adequate length on the cystic duct. The anchoring clip and catheters were then removed from the patient. 3 clips were placed  proximally on the cystic duct and the duct was divided between the 2 sets of clips. Posterior to this the cystic artery was identified and again dissected bluntly in a circumferential manner until a good window  was created. 2 clips were placed proximally and one distally on  the artery and the artery was divided between the 2 sets of clips. Next a laparoscopic hook cautery device was used to separate the gallbladder from the liver bed. Prior to completely detaching the gallbladder from the liver bed the liver bed was inspected and several small bleeding points were coagulated with the electrocautery until the area was completely hemostatic. The gallbladder was then detached the rest of it from the liver bed without difficulty. A laparoscopic bag was inserted through the hassan port. The laparoscope was moved to the epigastric port. The gallbladder was placed within the bag and the bag was sealed.  The bag with the gallbladder was then removed with the [facility] cannula through the infraumbilical port without difficulty. The fascial defect was then closed with the previously placed Vicryl pursestring stitch as well as with another figure-of-eight 0 Vicryl stitch. The liver bed was inspected again and found to be hemostatic. The abdomen was irrigated with copious amounts of saline until the effluent was clear. The ports were then removed under direct vision without difficulty and were found to be hemostatic. The gas was allowed to escape. The skin incisions were all closed with interrupted 4-0 Monocryl subcuticular stitches. Dermabond dressings were applied. The patient tolerated the procedure well. At the end of the case all needle sponge and instrument counts were correct. The patient was then awakened and taken to recovery in stable condition  PLAN OF CARE: Discharge to home after PACU  PATIENT DISPOSITION:  PACU - hemodynamically stable.   Delay start of Pharmacological VTE agent (>24hrs) due to surgical blood loss or risk of bleeding: not applicable

## 2015-05-13 NOTE — Anesthesia Preprocedure Evaluation (Addendum)
Anesthesia Evaluation  Patient identified by MRN, date of birth, ID band Patient awake    Reviewed: Allergy & Precautions, NPO status , Patient's Chart, lab work & pertinent test results  History of Anesthesia Complications (+) PONV  Airway Mallampati: I       Dental   Pulmonary    Pulmonary exam normal       Cardiovascular Normal cardiovascular exam    Neuro/Psych    GI/Hepatic GERD-  ,  Endo/Other    Renal/GU      Musculoskeletal   Abdominal   Peds  Hematology   Anesthesia Other Findings   Reproductive/Obstetrics                             Anesthesia Physical Anesthesia Plan  ASA: II  Anesthesia Plan: General   Post-op Pain Management:    Induction: Intravenous  Airway Management Planned: Oral ETT  Additional Equipment:   Intra-op Plan:   Post-operative Plan: Extubation in OR  Informed Consent: I have reviewed the patients History and Physical, chart, labs and discussed the procedure including the risks, benefits and alternatives for the proposed anesthesia with the patient or authorized representative who has indicated his/her understanding and acceptance.     Plan Discussed with: CRNA, Anesthesiologist and Surgeon  Anesthesia Plan Comments:         Anesthesia Quick Evaluation

## 2015-05-13 NOTE — H&P (Signed)
Jenny Henderson 04/13/2015 2:51 PM Location: [facility] Patient #: [id number] DOB: 1951-08-26 Married / Language: Undefined / Race: Undefined Female  History of Present Illness Sammuel Hines. Marlou Starks MD; 04/14/2015 10:16 AM) Patient words: gallbladder.  The patient is a 64 year old female who presents with abdominal pain. We are asked to see the patient in consultation by Dr. Bing Matter to evaluate her for gallstones. The patient is a 64 year old white female who first developed an episode of substernal chest pain back in March. The pain radiated into her back. The pain was associated with nausea but no vomiting. Antacids did not help her nausea. The pain gradually resolved. She had a second episode about 2 weeks ago of epigastric and right upper quadrant pain that lasted about 30 minutes. A recent ultrasound showed a 1.2 cm stone in her gallbladder but no gallbladder wall thickening or ductal dilatation   Other Problems Elbert Ewings, CMA; 04/13/2015 2:51 PM) Back Pain Gastroesophageal Reflux Disease General anesthesia - complications Kidney Stone  Past Surgical History Elbert Ewings, CMA; 04/13/2015 2:51 PM) Cataract Surgery Left. Cesarean Section - 1 Colon Polyp Removal - Colonoscopy  Diagnostic Studies History Elbert Ewings, CMA; 04/13/2015 2:51 PM) Colonoscopy within last year Mammogram within last year Pap Smear 1-5 years ago  Allergies Elbert Ewings, CMA; 04/13/2015 2:52 PM) Codeine Sulfate *ANALGESICS - OPIOID* Nausea, Urticaria, Vomiting.  Medication History Elbert Ewings, CMA; 04/13/2015 2:53 PM) Aspirin (81MG  Tablet, Oral) Active. Flonase (50MCG/ACT Suspension, Nasal) Active. Prevacid (15MG  Capsule DR, Oral) Active. Medications Reconciled  Social History Elbert Ewings, [place]; 04/13/2015 2:51 PM) Alcohol use Occasional alcohol use. Caffeine use Carbonated beverages, Coffee. No drug use Tobacco use Never smoker.  Family History Elbert Ewings, [place];  04/13/2015 2:51 PM) Breast Cancer Family Members In General. Cerebrovascular Accident Mother. Colon Cancer Family Members In General. Colon Polyps Family Members In General. Diabetes Mellitus Family Members In General. Heart Disease Father. Hypertension Mother. Kidney Disease Family Members In General. Respiratory Condition Family Members In General.  Pregnancy / Birth History Elbert Ewings, [place]; 04/13/2015 2:51 PM) Age at menarche 13 years. Age of menopause 85-55 Gravida 1 Maternal age 53-30 Para 1  Review of Systems Elbert Ewings CMA; 04/13/2015 2:51 PM) General Not Present- Appetite Loss, Chills, Fatigue, Fever, Night Sweats, Weight Gain and Weight Loss. Skin Not Present- Change in Wart/Mole, Dryness, Hives, Jaundice, New Lesions, Non-Healing Wounds, Rash and Ulcer. HEENT Present- Visual Disturbances and Wears glasses/contact lenses. Not Present- Earache, Hearing Loss, Hoarseness, Nose Bleed, Oral Ulcers, Ringing in the Ears, Seasonal Allergies, Sinus Pain, Sore Throat and Yellow Eyes. Respiratory Not Present- Bloody sputum, Chronic Cough, Difficulty Breathing, Snoring and Wheezing. Breast Not Present- Breast Mass, Breast Pain, Nipple Discharge and Skin Changes. Cardiovascular Present- Chest Pain. Not Present- Difficulty Breathing Lying Down, Leg Cramps, Palpitations, Rapid Heart Rate, Shortness of Breath and Swelling of Extremities. Gastrointestinal Present- Bloating, Change in Bowel Habits, Excessive gas and Gets full quickly at meals. Not Present- Abdominal Pain, Bloody Stool, Chronic diarrhea, Constipation, Difficulty Swallowing, Hemorrhoids, Indigestion, Nausea, Rectal Pain and Vomiting. Female Genitourinary Present- Frequency. Not Present- Nocturia, Painful Urination, Pelvic Pain and Urgency. Musculoskeletal Not Present- Back Pain, Joint Pain, Joint Stiffness, Muscle Pain, Muscle Weakness and Swelling of Extremities. Neurological Not Present- Decreased Memory, Fainting,  Headaches, Numbness, Seizures, Tingling, Tremor, Trouble walking and Weakness. Psychiatric Not Present- Anxiety, Bipolar, Change in Sleep Pattern, Depression, Fearful and Frequent crying. Endocrine Not Present- Cold Intolerance, Excessive Hunger, Hair Changes, Heat Intolerance, Hot flashes and New Diabetes. Hematology Present- Easy  Bruising. Not Present- Excessive bleeding, Gland problems, HIV and Persistent Infections.   Vitals Elbert Ewings CMA; 04/13/2015 2:53 PM) 04/13/2015 2:53 PM Weight: 140 lb Height: 62in Body Surface Area: 1.67 m Body Mass Index: 25.61 kg/m Temp.: 98.65F(Oral)  Pulse: 82 (Regular)  Resp.: 17 (Unlabored)  BP: 122/68 (Sitting, Left Arm, Standard)    Physical Exam Eddie Dibbles S. Marlou Starks MD; 04/14/2015 10:17 AM) General Mental Status-Alert. General Appearance-Consistent with stated age. Hydration-Well hydrated. Voice-Normal.  Head and Neck Head-normocephalic, atraumatic with no lesions or palpable masses. Trachea-midline. Thyroid Gland Characteristics - normal size and consistency.  Eye Eyeball - Bilateral-Extraocular movements intact. Sclera/Conjunctiva - Bilateral-No scleral icterus.  Chest and Lung Exam Chest and lung exam reveals -quiet, even and easy respiratory effort with no use of accessory muscles and on auscultation, normal breath sounds, no adventitious sounds and normal vocal resonance. Inspection Chest Wall - Normal. Back - normal.  Cardiovascular Cardiovascular examination reveals -normal heart sounds, regular rate and rhythm with no murmurs and normal pedal pulses bilaterally.  Abdomen Note: The abdomen is soft with minimal tenderness. There is no palpable mass.   Neurologic Neurologic evaluation reveals -alert and oriented x 3 with no impairment of recent or remote memory. Mental Status-Normal.  Musculoskeletal Normal Exam - Left-Upper Extremity Strength Normal and Lower Extremity Strength  Normal. Normal Exam - Right-Upper Extremity Strength Normal and Lower Extremity Strength Normal.  Lymphatic Head & Neck  General Head & Neck Lymphatics: Bilateral - Description - Normal. Axillary  General Axillary Region: Bilateral - Description - Normal. Tenderness - Non Tender. Femoral & Inguinal  Generalized Femoral & Inguinal Lymphatics: Bilateral - Description - Normal. Tenderness - Non Tender.    Assessment & Plan Eddie Dibbles S. Marlou Starks MD; 04/13/2015 3:21 PM) GALLSTONES (574.20  K80.20) Impression: The patient appears to have symptomatic gallstones. Because of the risk of further painful episodes and possible pancreatitis I think she would benefit from having her gallbladder removed. She would also like to have this done. I have discussed with her in detail the risks and benefits of the operation to remove the gallbladder as well as some of the technical aspects and she understands and wishes to proceed. Plan for laparoscopic cholecystectomy with intraoperative cholangiogram Current Plans  Pt Education - Gallstones: discussed with patient and provided information.   Signed by Luella Cook, MD (04/14/2015 10:18 AM)

## 2015-05-13 NOTE — Transfer of Care (Signed)
Immediate Anesthesia Transfer of Care Note  Patient: Jenny Henderson  Procedure(s) Performed: Procedure(s): LAPAROSCOPIC CHOLECYSTECTOMY WITH INTRAOPERATIVE CHOLANGIOGRAM (N/A)  Patient Location: PACU  Anesthesia Type:General  Level of Consciousness: awake, alert , oriented and patient cooperative  Airway & Oxygen Therapy: Patient Spontanous Breathing and Patient connected to nasal cannula oxygen  Post-op Assessment: Report given to RN, Post -op Vital signs reviewed and stable and Patient moving all extremities  Post vital signs: Reviewed and stable  Last Vitals:  Filed Vitals:   05/13/15 1140  BP: 140/65  Pulse: 64  Temp: 36.4 C  Resp: 20    Complications: No apparent anesthesia complications

## 2015-05-13 NOTE — Anesthesia Procedure Notes (Signed)
Procedure Name: Intubation Date/Time: 05/13/2015 1:34 PM Performed by: Merdis Delay Pre-anesthesia Checklist: Patient being monitored, Suction available, Emergency Drugs available, Patient identified and Timeout performed Patient Re-evaluated:Patient Re-evaluated prior to inductionOxygen Delivery Method: Circle system utilized Preoxygenation: Pre-oxygenation with 100% oxygen Intubation Type: IV induction Ventilation: Mask ventilation without difficulty Laryngoscope Size: Mac and 3 Grade View: Grade I Tube type: Oral Tube size: 7.0 mm Number of attempts: 1 Airway Equipment and Method: Stylet and LTA kit utilized Placement Confirmation: positive ETCO2,  CO2 detector,  ETT inserted through vocal cords under direct vision and breath sounds checked- equal and bilateral Secured at: 22 cm Tube secured with: Tape Dental Injury: Teeth and Oropharynx as per pre-operative assessment

## 2015-05-14 ENCOUNTER — Encounter (HOSPITAL_COMMUNITY): Payer: Self-pay | Admitting: Anesthesiology

## 2015-05-14 NOTE — Anesthesia Postprocedure Evaluation (Signed)
  Anesthesia Post-op Note  Patient: Jenny Henderson  Procedure(s) Performed: Procedure(s): LAPAROSCOPIC CHOLECYSTECTOMY WITH INTRAOPERATIVE CHOLANGIOGRAM (N/A)  Patient Location: PACU  Anesthesia Type:General  Level of Consciousness: awake, alert , oriented and patient cooperative  Airway and Oxygen Therapy: Patient Spontanous Breathing  Post-op Pain: mild  Post-op Assessment: Post-op Vital signs reviewed, Patient's Cardiovascular Status Stable, Respiratory Function Stable, Patent Airway, No signs of Nausea or vomiting and Pain level controlled              Post-op Vital Signs: stable  Last Vitals:  Filed Vitals:   05/13/15 1700  BP:   Pulse: 68  Temp: 36.6 C  Resp: 23    Complications: No apparent anesthesia complications

## 2015-08-29 IMAGING — RF DG CHOLANGIOGRAM OPERATIVE
1 series · 4 of 4 positions shown · non-contrast
Comparison: None.

CLINICAL DATA: Gallstones

EXAM:
INTRAOPERATIVE CHOLANGIOGRAM
TECHNIQUE: Cholangiographic images from the C-arm fluoroscopic device were
submitted for interpretation post-operatively. Please see the
procedural report for the amount of contrast and the fluoroscopy
time utilized.

[Series 1: run · 2 acquisitions, 4 frames shown]
[im 1/2]
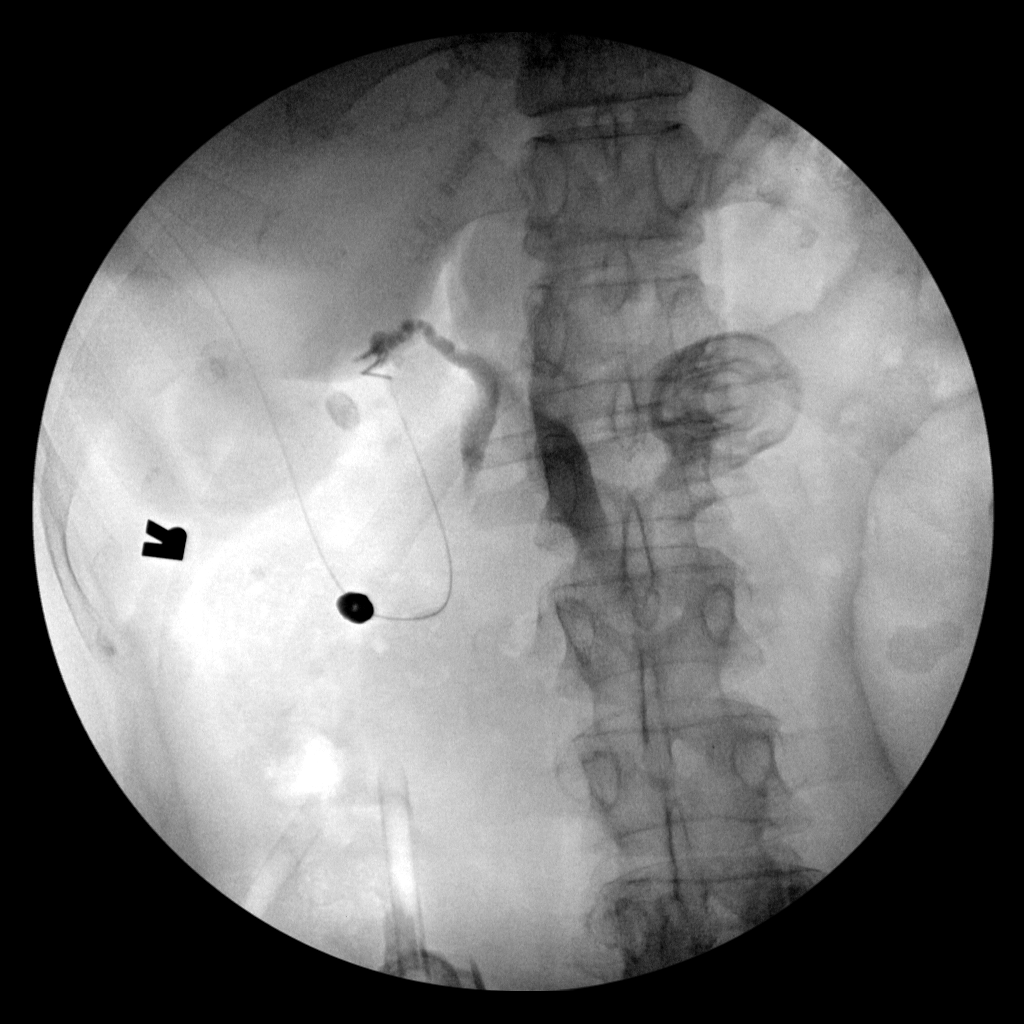
[im 1/2]
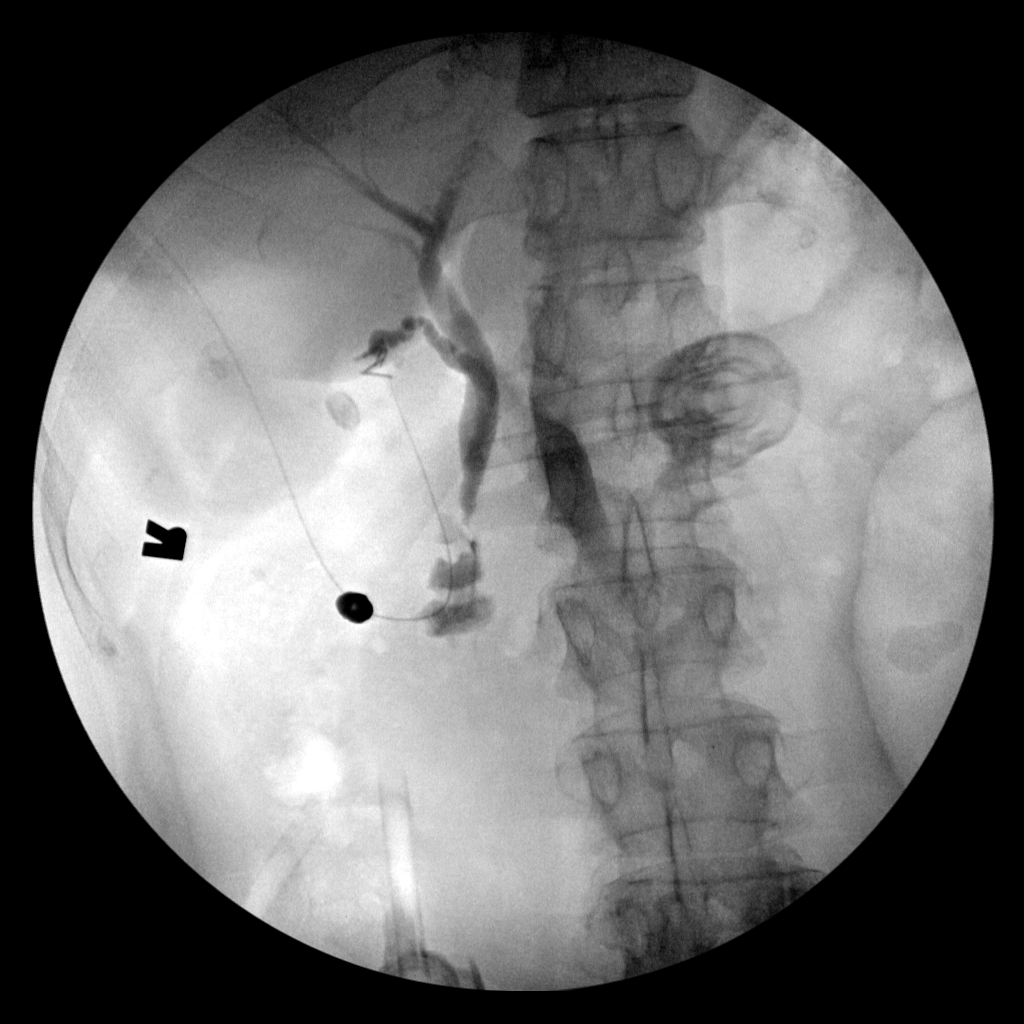
[im 1/2]
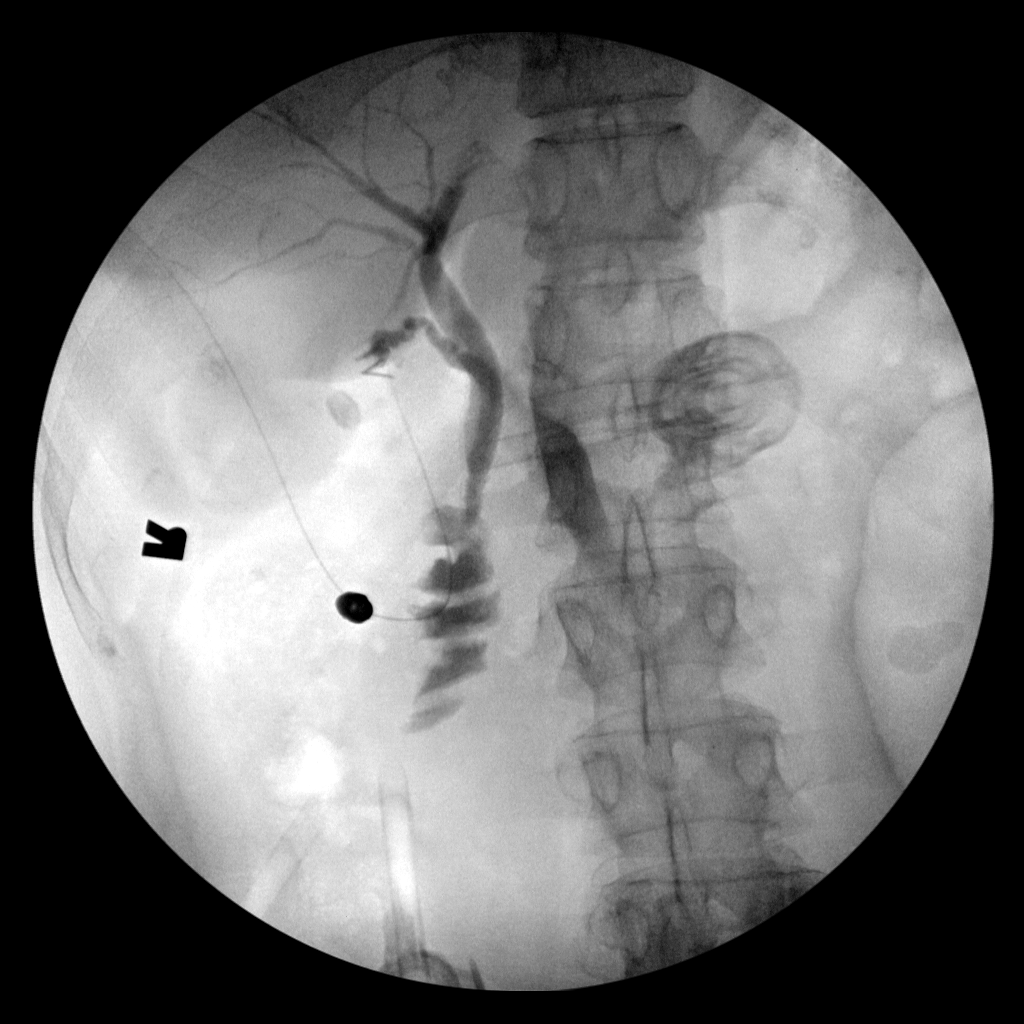
[im 2/2]
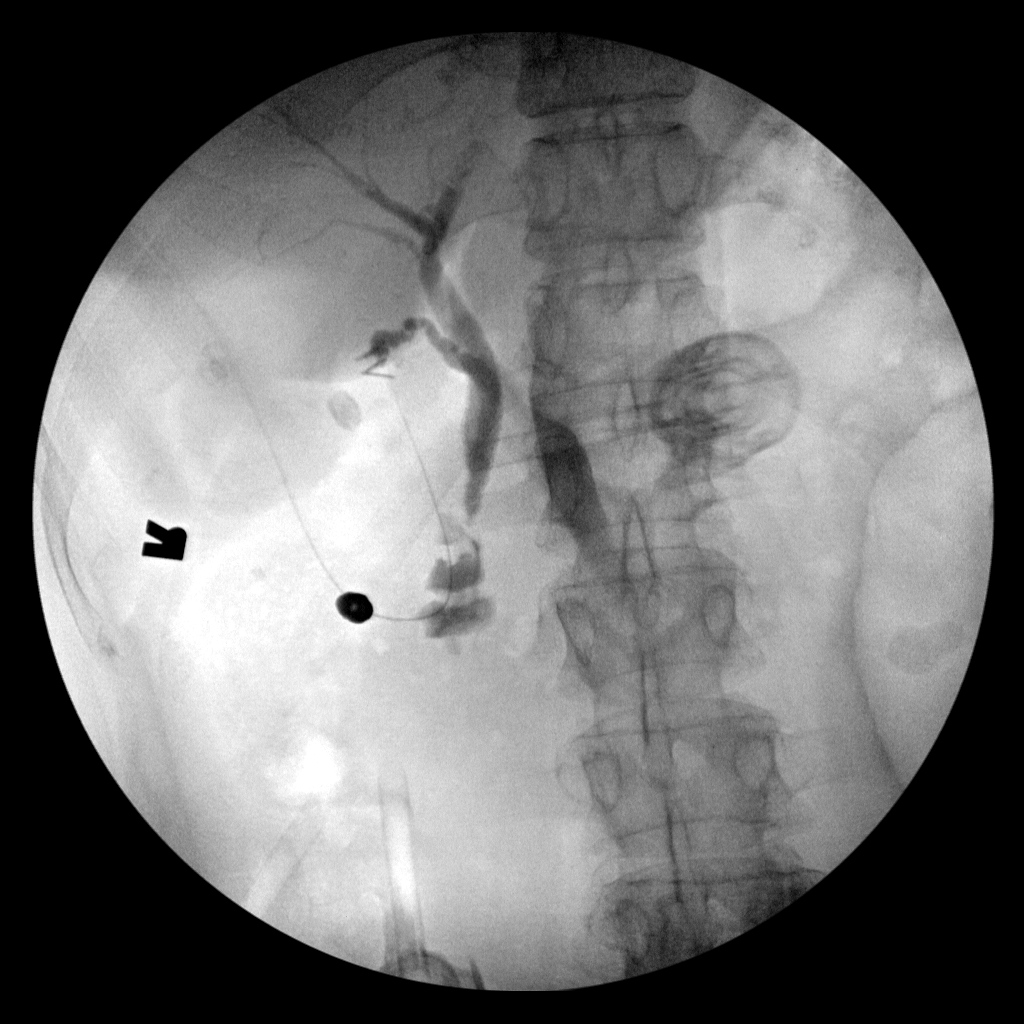

[4 of 4 positions shown; findings below may reference images not displayed]

FINDINGS: Contrast fills the biliary tree and duodenum. There is no filling
defect in the common bile duct.
IMPRESSION: Patent biliary tree without evidence of common bile duct stones.

## 2018-03-29 DIAGNOSIS — J302 Other seasonal allergic rhinitis: Secondary | ICD-10-CM | POA: Insufficient documentation

## 2018-11-21 ENCOUNTER — Other Ambulatory Visit: Payer: Self-pay | Admitting: Obstetrics and Gynecology

## 2018-11-21 DIAGNOSIS — N6310 Unspecified lump in the right breast, unspecified quadrant: Secondary | ICD-10-CM

## 2018-11-21 DIAGNOSIS — N63 Unspecified lump in unspecified breast: Secondary | ICD-10-CM | POA: Insufficient documentation

## 2018-11-28 ENCOUNTER — Ambulatory Visit
Admission: RE | Admit: 2018-11-28 | Discharge: 2018-11-28 | Disposition: A | Discharge status: Home / Self Care | Payer: Medicare HMO | Source: Ambulatory Visit | Attending: Obstetrics and Gynecology | Admitting: Obstetrics and Gynecology

## 2018-11-28 ENCOUNTER — Ambulatory Visit
Admission: RE | Admit: 2018-11-28 | Discharge: 2018-11-28 | Disposition: A | Discharge status: Home / Self Care | Payer: BLUE CROSS/BLUE SHIELD | Source: Ambulatory Visit | Attending: Obstetrics and Gynecology | Admitting: Obstetrics and Gynecology

## 2018-11-28 DIAGNOSIS — N6310 Unspecified lump in the right breast, unspecified quadrant: Secondary | ICD-10-CM

## 2019-08-21 ENCOUNTER — Encounter: Payer: Self-pay | Admitting: Gastroenterology

## 2019-08-28 ENCOUNTER — Ambulatory Visit: Payer: Medicare HMO

## 2019-08-28 ENCOUNTER — Encounter: Payer: Self-pay | Admitting: Cardiology

## 2019-08-28 ENCOUNTER — Other Ambulatory Visit: Payer: Self-pay

## 2019-08-28 ENCOUNTER — Ambulatory Visit (INDEPENDENT_AMBULATORY_CARE_PROVIDER_SITE_OTHER): Payer: Medicare HMO | Admitting: Cardiology

## 2019-08-28 VITALS — BP 143/73 | HR 66 | Temp 97.7°F | Ht 62.0 in | Wt 123.3 lb

## 2019-08-28 DIAGNOSIS — E785 Hyperlipidemia, unspecified: Secondary | ICD-10-CM | POA: Insufficient documentation

## 2019-08-28 DIAGNOSIS — R072 Precordial pain: Secondary | ICD-10-CM | POA: Insufficient documentation

## 2019-08-28 DIAGNOSIS — R079 Chest pain, unspecified: Secondary | ICD-10-CM

## 2019-08-28 DIAGNOSIS — R011 Cardiac murmur, unspecified: Secondary | ICD-10-CM | POA: Insufficient documentation

## 2019-08-28 DIAGNOSIS — R002 Palpitations: Secondary | ICD-10-CM

## 2019-08-28 MED ORDER — METOPROLOL TARTRATE 25 MG PO TABS: 25.0000 mg | ORAL_TABLET | Freq: Two times a day (BID) | ORAL | 1 refills | Status: DC

## 2019-08-28 NOTE — Patient Instructions (Signed)
Your cardiac CT will be scheduled at one of the below locations:   [facility]  [address]  [place], [place] [address]  [phone]    Please arrive at the [facility] main entrance of [facility] 30-45 minutes prior to test start time.  Proceed to the [facility] Radiology Department (first floor) to check-in and test prep.    Please follow these instructions carefully (unless otherwise directed):   Hold all erectile dysfunction medications at least 3 days (72 hrs) prior to test.   On the Night Before the Test:   Be sure to Drink plenty of water.   Do not consume any caffeinated/decaffeinated beverages or chocolate 12 hours prior to your test.   Do not take any antihistamines 12 hours prior to your test.   On the Day of the Test:   Drink plenty of water. Do not drink any water within one hour of the test.   Do not eat any food 4 hours prior to the test.   You may take your regular medications prior to the test.   Take your metoprolol (Lopressor) two hours prior to test, and take the medication with you to your test.   FEMALES- please wear underwire-free bra if available      After the Test:   Drink plenty of water.   After receiving IV contrast, you may experience a mild flushed feeling. This is normal.   On occasion, you may experience a mild rash up to 24 hours after the test. This is not dangerous. If this occurs, you can take Benadryl 25 mg and increase your fluid intake.   If you experience trouble breathing, this can be serious. If it is severe call 911 IMMEDIATELY. If it is mild, please call our office.   If you take any of these medications: Glipizide/Metformin, Avandament, Glucavance, please do not take 48 hours after completing test unless otherwise instructed.     Please contact the cardiac imaging nurse navigator should you have any questions/concerns  Marchia Bond, RN Navigator Cardiac Imaging  [place] [facility]  [phone] Office  [phone] Cell

## 2019-08-28 NOTE — Progress Notes (Signed)
Patient referred by Aletha Halim., PA-C for chest pain  Subjective:   Jenny Henderson, female    DOB: 03/13/1951, 68 y.o.   MRN: [id number]   Chief Complaint  Patient presents with  . Palpitations  . Chest Pain  . New Patient (Initial Visit)     HPI  68 year old Caucasian female referred for evaluation of chest pain.  Patient has no known medical comorbidities.  She does have family history of early CVD in father and mother.  Few weeks ago, patient had an episode of chest pain at rest while watching TV.  It radiated from front of her chest to back, and to her jaw.  Pain lasted for several minutes, and resolved on its own.  She did not have any other associated symptoms.  She had 1 more episode of similar symptoms few days later.  She does not have any chest pain with physical activity.  She has noticed episodes of skipped beats associated with lightheadedness, but no syncope.  Patient previously had a normal stress test in 2016.   Past Medical History:  Diagnosis Date  . Anginal pain ([place]) 3/16   stress on chart- ? gallstones  . Chest pain 01/27/2015   ETT-Myoview 4/16:  no ischemia, EF 60%  . GERD (gastroesophageal reflux disease)   . History of kidney stones   . Hypoglycemia   . PONV (postoperative nausea and vomiting)      Past Surgical History:  Procedure Laterality Date  . CATARACT EXTRACTION     left eye  . CESAREAN SECTION    . CHOLECYSTECTOMY N/A 05/13/2015   Procedure: LAPAROSCOPIC CHOLECYSTECTOMY WITH INTRAOPERATIVE CHOLANGIOGRAM;  Surgeon: Autumn Messing III, MD;  Location: [facility];  Service: General;  Laterality: N/A;  . EYE SURGERY    . OTOPLASTY     bilat, OD x 2     Social History   Socioeconomic History  . Marital status: Married    Spouse name: Not on file  . Number of children: 1  . Years of education: Not on file  . Highest education level: Not on file  Occupational History  . Not on file  Social Needs  . Financial resource strain: Not  on file  . Food insecurity    Worry: Not on file    Inability: Not on file  . Transportation needs    Medical: Not on file    Non-medical: Not on file  Tobacco Use  . Smoking status: Never Smoker  . Smokeless tobacco: Never Used  Substance and Sexual Activity  . Alcohol use: Yes    Alcohol/week: 2.0 standard drinks    Types: 2 Glasses of wine per week    Comment: occossional   . Drug use: No  . Sexual activity: Not on file  Lifestyle  . Physical activity    Days per week: Not on file    Minutes per session: Not on file  . Stress: Not on file  Relationships  . Social Herbalist on phone: Not on file    Gets together: Not on file    Attends religious service: Not on file    Active member of club or organization: Not on file    Attends meetings of clubs or organizations: Not on file    Relationship status: Not on file  . Intimate partner violence    Fear of current or ex partner: Not on file    Emotionally abused: Not on file  Physically abused: Not on file    Forced sexual activity: Not on file  Other Topics Concern  . Not on file  Social History Narrative  . Not on file     Family History  Problem Relation Age of Onset  . Alzheimer's disease Mother        61 yrs  . CVA Mother 38  . Colon cancer Paternal Grandmother   . Other Father   . Heart attack Father      Current Outpatient Medications on File Prior to Visit  Medication Sig Dispense Refill  . acetaminophen (TYLENOL) 500 MG tablet Take 500 mg by mouth every 6 (six) hours as needed.    Marland Kitchen Apoaequorin (PREVAGEN) 10 MG CAPS Take 1 capsule by mouth daily.    . carboxymethylcellulose (REFRESH PLUS) 0.5 % SOLN Place 1 drop into the left eye 2 (two) times daily as needed.    . fluticasone (FLONASE) 50 MCG/ACT nasal spray Place 1 spray into both nostrils at bedtime.     . NON FORMULARY Vita fusion gummy    . Probiotic Product (PRO-BIOTIC BLEND PO) Take 1 capsule by mouth daily. 60 billion BTU     No  current facility-administered medications on file prior to visit.     Cardiovascular studies:  EKG 08/28/2019: Sinus rhythm 64 bpm. Normal EKG.  Recent labs: 08/13/2019: Glucose 88.  BUN/creatinine 18/0.69.  EGFR 89.  Sodium 140, potassium 4.3.  Rest of the CMP normal. H/H 13/41.  MCV 93.  Platelets 285. Cholesterol 221, triglycerides 70, HDL 79, LDL 137.   Review of Systems  Constitution: Negative for decreased appetite, malaise/fatigue, weight gain and weight loss.  HENT: Negative for congestion.   Eyes: Negative for visual disturbance.  Cardiovascular: Positive for chest pain and palpitations. Negative for dyspnea on exertion, leg swelling and syncope.  Respiratory: Negative for cough.   Endocrine: Negative for cold intolerance.  Hematologic/Lymphatic: Does not bruise/bleed easily.  Skin: Negative for itching and rash.  Musculoskeletal: Negative for myalgias.  Gastrointestinal: Negative for abdominal pain, nausea and vomiting.  Genitourinary: Negative for dysuria.  Neurological: Positive for light-headedness. Negative for dizziness and weakness.  Psychiatric/Behavioral: The patient is not nervous/anxious.   All other systems reviewed and are negative.        Vitals:   08/28/19 1103  BP: (!) 143/73  Pulse: 66  Temp: 97.7 F (36.5 C)  SpO2: 99%     Body mass index is 22.55 kg/m. Filed Weights   08/28/19 1103  Weight: 55.9 kg     Objective:   Physical Exam  Constitutional: She is oriented to person, place, and time. She appears well-developed and well-nourished. No distress.  HENT:  Head: Normocephalic and atraumatic.  Eyes: Pupils are equal, round, and reactive to light. Conjunctivae are normal.  Neck: No JVD present.  Cardiovascular: Normal rate, regular rhythm and intact distal pulses.  Murmur heard.  Early systolic murmur is present with a grade of 2/6 at the upper right sternal border. Pulmonary/Chest: Effort normal and breath sounds normal. She  has no wheezes. She has no rales.  Abdominal: Soft. Bowel sounds are normal. There is no rebound.  Musculoskeletal:        General: No edema.  Lymphadenopathy:    She has no cervical adenopathy.  Neurological: She is alert and oriented to person, place, and time. No cranial nerve deficit.  Skin: Skin is warm and dry.  Psychiatric: She has a normal mood and affect.  Nursing note and vitals reviewed.  Assessment & Recommendations:   68 year old Caucasian female with dyslipidemia, family history of early CAD, now with atypical chest pain episodes at rest.  Chest pain: While description is concerning for angina, she does not have any chest pain with exertion.  Given her family history of dyslipidemia, and prior negative stress test, will obtain coronary CT angiogram.  Started on metoprolol 25 mg twice daily for better rate control before the CTA.  Dyslipidemia: While her LDL is elevated at 137, HDL is excellent at 79.  Will await results of CTA before discussing statin therapy.  Palpitations: Likely benign PACs/PVCs.  Will get 24-hour Holter monitor and echocardiogram.  Further recommendations after above testing.  Thank you for referring the patient to [place]. Please feel free to contact with any questions.  Nigel Mormon, MD [facility]. [facility] Pager: [phone] Office: [phone] If no answer Cell [phone]

## 2019-08-30 ENCOUNTER — Other Ambulatory Visit: Payer: Self-pay

## 2019-08-30 ENCOUNTER — Ambulatory Visit (INDEPENDENT_AMBULATORY_CARE_PROVIDER_SITE_OTHER): Payer: Medicare HMO

## 2019-08-30 DIAGNOSIS — R002 Palpitations: Secondary | ICD-10-CM | POA: Diagnosis not present

## 2019-08-30 DIAGNOSIS — R011 Cardiac murmur, unspecified: Secondary | ICD-10-CM

## 2019-09-02 ENCOUNTER — Telehealth: Payer: Self-pay

## 2019-09-04 NOTE — Progress Notes (Signed)
No answer and VM is full

## 2019-09-05 NOTE — Progress Notes (Signed)
Gave patient results she verbalized understanding.

## 2019-09-11 ENCOUNTER — Other Ambulatory Visit (HOSPITAL_COMMUNITY): Payer: Self-pay | Admitting: Cardiology

## 2019-09-12 ENCOUNTER — Telehealth (HOSPITAL_COMMUNITY): Payer: Self-pay | Admitting: Emergency Medicine

## 2019-09-12 LAB — BASIC METABOLIC PANEL
BUN/Creatinine Ratio: 20 (ref 12–28)
BUN: 16 mg/dL (ref 8–27)
CO2: 23 mmol/L (ref 20–29)
Calcium: 10 mg/dL (ref 8.7–10.3)
Chloride: 103 mmol/L (ref 96–106)
Creatinine, Ser: 0.81 mg/dL (ref 0.57–1.00)
GFR calc Af Amer: 86 mL/min/{1.73_m2} (ref >59)
GFR calc non Af Amer: 74 mL/min/{1.73_m2} (ref >59)
Glucose: 89 mg/dL (ref 65–99)
Potassium: 4.7 mmol/L (ref 3.5–5.2)
Sodium: 140 mmol/L (ref 134–144)

## 2019-09-12 NOTE — Telephone Encounter (Signed)
Reaching out to patient to offer assistance regarding upcoming cardiac imaging study; pt verbalizes understanding of appt date/time, parking situation and where to check in, pre-test NPO status and medications ordered, and verified current allergies; name and call back number provided for further questions should they arise Jenny Zeitlin RN Navigator Cardiac Imaging [facility] [phone] office [phone] cell 

## 2019-09-16 ENCOUNTER — Other Ambulatory Visit: Payer: Self-pay

## 2019-09-16 ENCOUNTER — Ambulatory Visit (HOSPITAL_COMMUNITY)
Admission: RE | Admit: 2019-09-16 | Discharge: 2019-09-16 | Disposition: A | Discharge status: Home / Self Care | Payer: Medicare HMO | Source: Ambulatory Visit | Attending: Cardiology | Admitting: Cardiology

## 2019-09-16 DIAGNOSIS — R072 Precordial pain: Secondary | ICD-10-CM | POA: Insufficient documentation

## 2019-09-16 DIAGNOSIS — R079 Chest pain, unspecified: Secondary | ICD-10-CM | POA: Diagnosis present

## 2019-09-16 MED ORDER — NITROGLYCERIN 0.4 MG SL SUBL
0.8000 mg | SUBLINGUAL_TABLET | Freq: Once | SUBLINGUAL | Status: AC
  Administered 2019-09-16: 09:00:00 0.8 mg via SUBLINGUAL

## 2019-09-16 MED ORDER — NITROGLYCERIN 0.4 MG SL SUBL
SUBLINGUAL_TABLET | SUBLINGUAL | Status: AC
  Filled 2019-09-16: qty 2

## 2019-09-16 MED ORDER — IOHEXOL 350 MG/ML SOLN
80.0000 mL | Freq: Once | INTRAVENOUS | Status: AC | PRN
  Administered 2019-09-16: 09:00:00 80 mL via INTRAVENOUS

## 2019-09-17 ENCOUNTER — Telehealth: Payer: Self-pay

## 2019-09-17 NOTE — Telephone Encounter (Signed)
Pt called due to a miss call from Dr. Virgina Jock, inform her about her CT results. Pt undestood

## 2019-09-17 NOTE — Progress Notes (Signed)
Please let her know I tried to call her, but could not leave voicemail. I will discuss the results with the patient in detail at the upcoming office visit.   Thanks MJP   CCTA 09/16/2019: 1. Coronary artery calcium score 108 Agatston units. This places the patient in the 75th percentile for age and gender, suggesting moderate to high risk for future cardiac events. 2.  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 3. No acute findings in the imaged extracardiac chest. 4. Aortic Atherosclerosis (ICD10-I70.0).

## 2019-09-19 ENCOUNTER — Telehealth: Payer: Self-pay

## 2019-09-19 DIAGNOSIS — E782 Mixed hyperlipidemia: Secondary | ICD-10-CM

## 2019-09-19 DIAGNOSIS — R079 Chest pain, unspecified: Secondary | ICD-10-CM

## 2019-09-19 DIAGNOSIS — R002 Palpitations: Secondary | ICD-10-CM

## 2019-09-19 DIAGNOSIS — R931 Abnormal findings on diagnostic imaging of heart and coronary circulation: Secondary | ICD-10-CM

## 2019-09-19 MED ORDER — ASPIRIN EC 81 MG PO TBEC: 81.0000 mg | DELAYED_RELEASE_TABLET | Freq: Every day | ORAL | 3 refills | Status: DC

## 2019-09-19 MED ORDER — ROSUVASTATIN CALCIUM 10 MG PO TABS: 10.0000 mg | ORAL_TABLET | Freq: Every day | ORAL | 3 refills | Status: DC

## 2019-09-19 MED ORDER — METOPROLOL TARTRATE 25 MG PO TABS: 25.0000 mg | ORAL_TABLET | Freq: Two times a day (BID) | ORAL | 3 refills | Status: DC

## 2019-09-19 NOTE — Telephone Encounter (Signed)
Coronary CTA 09/16/2019: 1. Coronary artery calcium score 108 Agatston units. This places the patient in the 75th percentile for age and gender, suggesting moderate to high risk for future cardiac events. 2.  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  Echocardiogram 08/30/2019: Left ventricle cavity is normal in size. Mild concentric hypertrophy of the left ventricle. Normal LV systolic function with EF 55%. Normal global wall motion. Diastolic function assessment limited due to severity of mitral regurgitation. Trileaflet aortic valve. Mild aortic valve leaflet thickening. Trace aortic valve stenosis. Moderate (Grade II) aortic regurgitation. Moderate (Grade III) mitral regurgitation. Moderate mitral valve leaflet thickening. Mild tricuspid regurgitation. Estimated pulmonary artery systolic pressure is 21 mmHg.  24-hour Holter monitor 08/28/2019: Sinus rhythm heart rate 44-111 bpm.  Average heart rate 65 bpm.   1.2% ventricular ectopy, 0.2% supraventricular ectopy. No Afib/flutter/high grade AV block, sinus pause > 3sec.   Discussed these findings with the patient.  Recommend aspirin 81 mg daily and Crestor 10 mg daily. Continue metoprolol tartarate. I will see her in 3 months.    Nigel Mormon, MD

## 2019-09-19 NOTE — Telephone Encounter (Signed)
Pt called asking for her CT scan results, could you please take a look at her CT thanks please advise

## 2019-10-03 ENCOUNTER — Encounter: Payer: Self-pay | Admitting: Gastroenterology

## 2019-10-10 ENCOUNTER — Ambulatory Visit: Payer: Medicare HMO | Admitting: Cardiology

## 2019-10-10 DIAGNOSIS — K2100 Gastro-esophageal reflux disease with esophagitis, without bleeding: Secondary | ICD-10-CM | POA: Insufficient documentation

## 2019-10-10 DIAGNOSIS — M810 Age-related osteoporosis without current pathological fracture: Secondary | ICD-10-CM | POA: Insufficient documentation

## 2019-10-10 DIAGNOSIS — J019 Acute sinusitis, unspecified: Secondary | ICD-10-CM | POA: Insufficient documentation

## 2019-10-10 DIAGNOSIS — K8020 Calculus of gallbladder without cholecystitis without obstruction: Secondary | ICD-10-CM | POA: Insufficient documentation

## 2019-10-10 DIAGNOSIS — R1013 Epigastric pain: Secondary | ICD-10-CM | POA: Insufficient documentation

## 2019-10-10 DIAGNOSIS — Z23 Encounter for immunization: Secondary | ICD-10-CM | POA: Insufficient documentation

## 2019-10-10 DIAGNOSIS — E7800 Pure hypercholesterolemia, unspecified: Secondary | ICD-10-CM | POA: Insufficient documentation

## 2019-10-10 DIAGNOSIS — Z9889 Other specified postprocedural states: Secondary | ICD-10-CM | POA: Insufficient documentation

## 2019-10-10 DIAGNOSIS — R112 Nausea with vomiting, unspecified: Secondary | ICD-10-CM | POA: Insufficient documentation

## 2019-10-16 ENCOUNTER — Telehealth: Payer: Self-pay

## 2019-10-16 NOTE — Telephone Encounter (Signed)
Called pt to inform her she can take Melatonin 5 mg at bedtime

## 2019-10-16 NOTE — Telephone Encounter (Signed)
She can take over the counter melatonin 5 mg.

## 2019-10-16 NOTE — Telephone Encounter (Signed)
Pt called asking if you could prescribe her something for sleep. Pt mention she call her pcp and they told her to call you due that she is taking Metoprolol and Crestor please advise thank you

## 2019-10-22 ENCOUNTER — Ambulatory Visit (AMBULATORY_SURGERY_CENTER): Payer: Medicare HMO | Admitting: *Deleted

## 2019-10-22 ENCOUNTER — Encounter: Payer: Self-pay | Admitting: Gastroenterology

## 2019-10-22 ENCOUNTER — Other Ambulatory Visit: Payer: Self-pay

## 2019-10-22 VITALS — Temp 96.2°F | Ht 62.0 in | Wt 123.8 lb

## 2019-10-22 DIAGNOSIS — Z86010 Personal history of colonic polyps: Secondary | ICD-10-CM

## 2019-10-22 DIAGNOSIS — R112 Nausea with vomiting, unspecified: Secondary | ICD-10-CM

## 2019-10-22 DIAGNOSIS — Z9889 Other specified postprocedural states: Secondary | ICD-10-CM

## 2019-10-22 DIAGNOSIS — Z1159 Encounter for screening for other viral diseases: Secondary | ICD-10-CM

## 2019-10-22 NOTE — Progress Notes (Signed)

## 2019-10-30 ENCOUNTER — Telehealth: Payer: Self-pay | Admitting: Gastroenterology

## 2019-10-30 ENCOUNTER — Other Ambulatory Visit (HOSPITAL_COMMUNITY)
Admission: RE | Admit: 2019-10-30 | Discharge: 2019-10-30 | Disposition: A | Discharge status: Home / Self Care | Payer: Medicare HMO | Source: Ambulatory Visit | Attending: Gastroenterology | Admitting: Gastroenterology

## 2019-10-30 ENCOUNTER — Other Ambulatory Visit: Payer: Self-pay

## 2019-10-30 DIAGNOSIS — Z20828 Contact with and (suspected) exposure to other viral communicable diseases: Secondary | ICD-10-CM | POA: Diagnosis not present

## 2019-10-30 DIAGNOSIS — Z01812 Encounter for preprocedural laboratory examination: Secondary | ICD-10-CM | POA: Insufficient documentation

## 2019-10-30 LAB — SARS CORONAVIRUS 2 (TAT 6-24 HRS): SARS Coronavirus 2: NEGATIVE

## 2019-10-30 NOTE — Telephone Encounter (Signed)
Pls call pt, she has some questions about how to take her medications before procedure.

## 2019-10-30 NOTE — Telephone Encounter (Signed)
Called pt and verified with her that it is ok to take her lopressor with clear liquids the day before and the day of procedure 3 hours before procedure time.

## 2019-11-05 ENCOUNTER — Other Ambulatory Visit: Payer: Self-pay | Admitting: Gastroenterology

## 2019-11-05 ENCOUNTER — Ambulatory Visit (AMBULATORY_SURGERY_CENTER): Payer: Medicare HMO | Admitting: Gastroenterology

## 2019-11-05 ENCOUNTER — Other Ambulatory Visit: Payer: Self-pay

## 2019-11-05 ENCOUNTER — Encounter: Payer: Self-pay | Admitting: Gastroenterology

## 2019-11-05 VITALS — BP 105/73 | HR 67 | Temp 97.9°F | Resp 14 | Ht 62.0 in | Wt 123.0 lb

## 2019-11-05 DIAGNOSIS — D128 Benign neoplasm of rectum: Secondary | ICD-10-CM | POA: Diagnosis not present

## 2019-11-05 DIAGNOSIS — D127 Benign neoplasm of rectosigmoid junction: Secondary | ICD-10-CM

## 2019-11-05 DIAGNOSIS — D129 Benign neoplasm of anus and anal canal: Secondary | ICD-10-CM

## 2019-11-05 DIAGNOSIS — Z86010 Personal history of colonic polyps: Secondary | ICD-10-CM | POA: Diagnosis present

## 2019-11-05 MED ORDER — SODIUM CHLORIDE 0.9 % IV SOLN: 500.0000 mL | Freq: Once | INTRAVENOUS | Status: DC

## 2019-11-05 NOTE — Progress Notes (Signed)
Pt's states no medical or surgical changes since previsit or office visit.  [place]

## 2019-11-05 NOTE — Progress Notes (Signed)
Report given to PACU, vss 

## 2019-11-05 NOTE — Progress Notes (Signed)
Called to room to assist during endoscopic procedure.  Patient ID and intended procedure confirmed with present staff. Received instructions for my participation in the procedure from the performing physician.  

## 2019-11-05 NOTE — Patient Instructions (Addendum)
YOU HAD AN ENDOSCOPIC PROCEDURE TODAY AT [facility]:   Refer to the procedure report that was given to you for any specific questions about what was found during the examination.  If the procedure report does not answer your questions, please call your gastroenterologist to clarify.  If you requested that your care partner not be given the details of your procedure findings, then the procedure report has been included in a sealed envelope for you to review at your convenience later.  YOU SHOULD EXPECT: Some feelings of bloating in the abdomen. Passage of more gas than usual.  Walking can help get rid of the air that was put into your GI tract during the procedure and reduce the bloating. If you had a lower endoscopy (such as a colonoscopy or flexible sigmoidoscopy) you may notice spotting of blood in your stool or on the toilet paper. If you underwent a bowel prep for your procedure, you may not have a normal bowel movement for a few days.  Please Note:  You might notice some irritation and congestion in your nose or some drainage.  This is from the oxygen used during your procedure.  There is no need for concern and it should clear up in a day or so.  SYMPTOMS TO REPORT IMMEDIATELY:   Following lower endoscopy (colonoscopy or flexible sigmoidoscopy):  Excessive amounts of blood in the stool  Significant tenderness or worsening of abdominal pains  Swelling of the abdomen that is new, acute  Fever of 100F or higher    For urgent or emergent issues, a gastroenterologist can be reached at any hour by calling [phone].   DIET:  We do recommend a small meal at first, but then you may proceed to your regular diet.  Drink plenty of fluids but you should avoid alcoholic beverages for 24 hours.  ACTIVITY:  You should plan to take it easy for the rest of today and you should NOT DRIVE or use heavy machinery until tomorrow (because of the sedation medicines used during the test).     FOLLOW UP: Our staff will call the number listed on your records 48-72 hours following your procedure to check on you and address any questions or concerns that you may have regarding the information given to you following your procedure. If we do not reach you, we will leave a message.  We will attempt to reach you two times.  During this call, we will ask if you have developed any symptoms of COVID 19. If you develop any symptoms (ie: fever, flu-like symptoms, shortness of breath, cough etc.) before then, please call [phone].  If you test positive for Covid 19 in the 2 weeks post procedure, please call and report this information to [place].    If any biopsies were taken you will be contacted by phone or by letter within the next 1-3 weeks.  Please call us at [phone] if you have not heard about the biopsies in 3 weeks.    SIGNATURES/CONFIDENTIALITY: You and/or your care partner have signed paperwork which will be entered into your electronic medical record.  These signatures attest to the fact that that the information above on your After Visit Summary has been reviewed and is understood.  Full responsibility of the confidentiality of this discharge information lies with you and/or your care-partner.     Handouts were given to you on polyps, diverticulosis, hemorrhoids, and a high fiber diet with liberal fluid intake. You may resume your  current medications today. Await biopsy results. Please call if any questions or concerns.

## 2019-11-05 NOTE — Progress Notes (Signed)
No problems noted in the recovery room. maw 

## 2019-11-05 NOTE — Op Note (Signed)
[facility] Patient Name: Jenny Henderson Procedure Date: 11/05/2019 9:45 AM MRN: [id number] Endoscopist: Justice Britain , MD Age: 69 Referring MD:  Date of Birth: [birth year] Gender: Female Account #: [id number] Procedure:                Colonoscopy Indications:              High risk colon cancer surveillance: Personal                            history of colonic polyps Medicines:                Monitored Anesthesia Care Procedure:                Pre-Anesthesia Assessment:                           - Prior to the procedure, a History and Physical                            was performed, and patient medications and                            allergies were reviewed. The patient's tolerance of                            previous anesthesia was also reviewed. The risks                            and benefits of the procedure and the sedation                            options and risks were discussed with the patient.                            All questions were answered, and informed consent                            was obtained. Prior Anticoagulants: The patient has                            taken no previous anticoagulant or antiplatelet                            agents except for aspirin. ASA Grade Assessment:                            III - A patient with severe systemic disease. After                            reviewing the risks and benefits, the patient was                            deemed in satisfactory condition to undergo the  procedure.                           After obtaining informed consent, the colonoscope                            was passed under direct vision. Throughout the                            procedure, the patient's blood pressure, pulse, and                            oxygen saturations were monitored continuously. The                            Colonoscope was introduced through the anus and              advanced to the 5 cm into the ileum. The                            colonoscopy was performed without difficulty. The                            patient tolerated the procedure. The quality of the                            bowel preparation was good. The terminal ileum,                            ileocecal valve, appendiceal orifice, and rectum                            were photographed. Scope In: 10:13:18 AM Scope Out: 10:28:24 AM Scope Withdrawal Time: 0 hours 11 minutes 15 seconds  Total Procedure Duration: 0 hours 15 minutes 6 seconds  Findings:                 The digital rectal exam findings include                            hemorrhoids. Pertinent negatives include no                            palpable rectal lesions.                           The terminal ileum and ileocecal valve appeared                            normal.                           Two sessile polyps were found in the rectum (1) and                            recto-sigmoid colon (1). The polyps were 3 to 4  mm                            in size. These polyps were removed with a cold                            snare. Resection and retrieval were complete.                           Multiple small-mouthed diverticula were found in                            the recto-sigmoid colon, sigmoid colon and                            descending colon.                           Normal mucosa was found in the entire colon                            otherwise.                           Non-bleeding non-thrombosed external and internal                            hemorrhoids were found during retroflexion, during                            perianal exam and during digital exam. The                            hemorrhoids were Grade II (internal hemorrhoids                            that prolapse but reduce spontaneously). Complications:            No immediate complications. Estimated Blood Loss:     Estimated  blood loss was minimal. Impression:               - Hemorrhoids found on digital rectal exam.                           - The examined portion of the ileum was normal.                           - Two 3 to 4 mm polyps in the rectum and at the                            recto-sigmoid colon, removed with a cold snare.                            Resected and retrieved.                           -  Diverticulosis in the recto-sigmoid colon, in the                            sigmoid colon and in the descending colon.                           - Normal mucosa in the entire examined colon                            otherwise.                           - Non-bleeding non-thrombosed external and internal                            hemorrhoids. Recommendation:           - The patient will be observed post-procedure,                            until all discharge criteria are met.                           - Discharge patient to home.                           - Patient has a contact number available for                            emergencies. The signs and symptoms of potential                            delayed complications were discussed with the                            patient. Return to normal activities tomorrow.                            Written discharge instructions were provided to the                            patient.                           - High fiber diet.                           - Continue present medications.                           - Await pathology results.                           - Repeat colonoscopy 5/7 years for surveillance                            based on pathology results.                           -  The findings and recommendations were discussed                            with the patient. Justice Britain, MD 11/05/2019 10:33:25 AM

## 2019-11-07 ENCOUNTER — Encounter: Payer: Self-pay | Admitting: Gastroenterology

## 2019-11-07 ENCOUNTER — Telehealth: Payer: Self-pay

## 2019-11-07 NOTE — Telephone Encounter (Signed)
  Follow up Call-  Call back number 11/05/2019  Post procedure Call Back phone  # [phone]  Permission to leave phone message Yes  Some recent data might be hidden     Patient questions:  Do you have a fever, pain , or abdominal swelling? No. Pain Score  0 *  Have you tolerated food without any problems? Yes.    Have you been able to return to your normal activities? Yes.    Do you have any questions about your discharge instructions: Diet   No. Medications  No. Follow up visit  No.  Do you have questions or concerns about your Care? No.  Actions: * If pain score is 4 or above: 1. No action needed, pain <4.Have you developed a fever since your procedure? no  2.   Have you had an respiratory symptoms (SOB or cough) since your procedure? no  3.   Have you tested positive for COVID 19 since your procedure no  4.   Have you had any family members/close contacts diagnosed with the COVID 19 since your procedure?  no   If yes to any of these questions please route to Joylene John, RN and Alphonsa Gin, Therapist, sports.

## 2019-11-20 ENCOUNTER — Ambulatory Visit: Payer: Medicare Other | Attending: Internal Medicine

## 2019-11-20 DIAGNOSIS — Z23 Encounter for immunization: Secondary | ICD-10-CM | POA: Insufficient documentation

## 2019-11-20 NOTE — Progress Notes (Signed)
   Covid-19 Vaccination Clinic  Name:  Aleezah Gwathney    MRN: [id number] DOB: [birth year]  11/20/2019  Ms. Diercks was observed post Covid-19 immunization for 15 minutes without incidence. She was provided with Vaccine Information Sheet and instruction to access the V-Safe system.   Ms. Lovern was instructed to call 911 with any severe reactions post vaccine: Marland Kitchen Difficulty breathing  . Swelling of your face and throat  . A fast heartbeat  . A bad rash all over your body  . Dizziness and weakness    Immunizations Administered    Name Date Dose VIS Date Route   Pfizer COVID-19 Vaccine 11/20/2019 10:49 AM 0.3 mL 10/11/2019 Intramuscular   Manufacturer: [place]   Lot: [id number]   [place]: [id number]

## 2019-12-08 DIAGNOSIS — I351 Nonrheumatic aortic (valve) insufficiency: Secondary | ICD-10-CM | POA: Insufficient documentation

## 2019-12-08 DIAGNOSIS — R931 Abnormal findings on diagnostic imaging of heart and coronary circulation: Secondary | ICD-10-CM | POA: Insufficient documentation

## 2019-12-08 DIAGNOSIS — I340 Nonrheumatic mitral (valve) insufficiency: Secondary | ICD-10-CM | POA: Insufficient documentation

## 2019-12-08 DIAGNOSIS — I2510 Atherosclerotic heart disease of native coronary artery without angina pectoris: Secondary | ICD-10-CM | POA: Insufficient documentation

## 2019-12-08 NOTE — Progress Notes (Signed)
Patient referred by Aletha Halim., PA-C for chest pain  Subjective:   Jenny Henderson, female    DOB: 1951-07-12, 69 y.o.   MRN: [id number]   Chief Complaint  Patient presents with  . Coronary Artery Disease  . Follow-up  . Results     HPI  69 y/o Caucasian female with nonobstructive CAD, mod AI, MR.  Doing well. She denies chest pain, shortness of breath, palpitations, leg edema, orthopnea, PND, TIA/syncope. Dizziness episodes have resolved.    Current Outpatient Medications on File Prior to Visit  Medication Sig Dispense Refill  . acetaminophen (TYLENOL) 500 MG tablet Take 500 mg by mouth every 6 (six) hours as needed.    Marland Kitchen Apoaequorin (PREVAGEN) 10 MG CAPS Take 1 capsule by mouth daily.    Marland Kitchen aspirin EC 81 MG tablet Take 1 tablet (81 mg total) by mouth daily. 90 tablet 3  . carboxymethylcellulose (REFRESH PLUS) 0.5 % SOLN Place 1 drop into the left eye 2 (two) times daily as needed.    . fluticasone (FLONASE) 50 MCG/ACT nasal spray Place 1 spray into both nostrils at bedtime.     . metoprolol tartrate (LOPRESSOR) 25 MG tablet Take 1 tablet (25 mg total) by mouth 2 (two) times daily. 60 tablet 3  . NON FORMULARY Vita fusion gummy    . Probiotic Product (PRO-BIOTIC BLEND PO) Take 1 capsule by mouth daily. 60 billion BTU    . rosuvastatin (CRESTOR) 10 MG tablet Take 1 tablet (10 mg total) by mouth daily. 30 tablet 3   No current facility-administered medications on file prior to visit.    Cardiovascular studies:  CTA coronary 09/2019: 1. Coronary artery calcium score 108 Agatston units. This places the patient in the 75th percentile for age and gender, suggesting moderate to high risk for future cardiac events. 2.  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  Echocardiogram 08/30/2019:  Left ventricle cavity is normal in size. Mild concentric hypertrophy of  the left ventricle. Normal LV systolic function with EF 55%. Normal global  wall motion. Diastolic  function assessment limited due to severity of  mitral regurgitation.  Trileaflet aortic valve. Mild aortic valve leaflet thickening. Trace  aortic valve stenosis. Moderate (Grade II) aortic regurgitation.  Moderate (Grade III) mitral regurgitation. Moderate mitral valve leaflet  thickening.  Mild tricuspid regurgitation. Estimated pulmonary artery systolic pressure  is 21 mmHg.  24-hour Holter monitor 08/28/2019: Sinus rhythm heart rate 44-111 bpm. Average heart rate 65 bpm.  1.2% ventricular ectopy, 0.2% supraventricular ectopy. No Afib/flutter/high grade AV block, sinus pause > 3sec.  EKG 08/28/2019: Sinus rhythm 64 bpm. Normal EKG.  Recent labs: 08/13/2019: Glucose 88.  BUN/creatinine 18/0.69.  EGFR 89.  Sodium 140, potassium 4.3.  Rest of the CMP normal. H/H 13/41.  MCV 93.  Platelets 285. Cholesterol 221, triglycerides 70, HDL 79, LDL 137.   Review of Systems  Cardiovascular: Negative for chest pain, dyspnea on exertion, leg swelling, palpitations and syncope.         Vitals:   12/09/19 1027  BP: (!) 142/71  Pulse: 63  Temp: 98.3 F (36.8 C)  SpO2: 97%     Body mass index is 23.06 kg/m. Filed Weights   12/09/19 1027  Weight: 126 lb 1.6 oz (57.2 kg)     Objective:   Physical Exam  Constitutional: No distress.  Neck: No JVD present.  Cardiovascular: Normal rate, regular rhythm and intact distal pulses.  Murmur heard.  Early systolic murmur is present  with a grade of 2/6 at the upper right sternal border. Pulmonary/Chest: Effort normal and breath sounds normal. She has no rales.  Musculoskeletal:        General: No edema.  Nursing note and vitals reviewed.         Assessment & Recommendations:   69 y/o Caucasian female with nonobstructive CAD, mod AI, MR.  CAD: Mild, nonobstructive on CTA 09/2019. Continue medical management with Aspirin, statin, heart healthy diet and lifestyle.   Dyslipidemia: On Crestor 10 mg. Repeat lipid panel in  6 months.  Mod AI, MR: Clinically asymptomatic. Will repeat echocardiogram in 6 months  Palpitations: Likely due to PAC's. Improved on metoprolol.  F/u in 6 months.   Nigel Mormon, MD [facility]. [facility] Pager: [phone] Office: [phone] If no answer Cell [phone]

## 2019-12-09 ENCOUNTER — Other Ambulatory Visit: Payer: Self-pay

## 2019-12-09 ENCOUNTER — Ambulatory Visit: Payer: Medicare HMO | Admitting: Cardiology

## 2019-12-09 ENCOUNTER — Encounter: Payer: Self-pay | Admitting: Cardiology

## 2019-12-09 VITALS — BP 142/71 | HR 63 | Temp 98.3°F | Ht 62.0 in | Wt 126.1 lb

## 2019-12-09 DIAGNOSIS — I2510 Atherosclerotic heart disease of native coronary artery without angina pectoris: Secondary | ICD-10-CM | POA: Diagnosis not present

## 2019-12-09 DIAGNOSIS — R931 Abnormal findings on diagnostic imaging of heart and coronary circulation: Secondary | ICD-10-CM | POA: Diagnosis not present

## 2019-12-09 DIAGNOSIS — E785 Hyperlipidemia, unspecified: Secondary | ICD-10-CM

## 2019-12-09 DIAGNOSIS — I340 Nonrheumatic mitral (valve) insufficiency: Secondary | ICD-10-CM

## 2019-12-09 DIAGNOSIS — I351 Nonrheumatic aortic (valve) insufficiency: Secondary | ICD-10-CM

## 2019-12-11 ENCOUNTER — Ambulatory Visit: Payer: Medicare HMO | Attending: Internal Medicine

## 2019-12-11 DIAGNOSIS — Z23 Encounter for immunization: Secondary | ICD-10-CM

## 2019-12-11 NOTE — Progress Notes (Signed)
   Covid-19 Vaccination Clinic  Name:  Jenny Henderson    MRN: [id number] DOB: 1951-10-30  12/11/2019  Jenny Henderson was observed post Covid-19 immunization for 15 minutes without incidence. She was provided with Vaccine Information Sheet and instruction to access the V-Safe system.   Jenny Henderson was instructed to call 911 with any severe reactions post vaccine: Marland Kitchen Difficulty breathing  . Swelling of your face and throat  . A fast heartbeat  . A bad rash all over your body  . Dizziness and weakness    Immunizations Administered    Name Date Dose VIS Date Route   Pfizer COVID-19 Vaccine 12/11/2019  2:35 PM 0.3 mL 10/11/2019 Intramuscular   Manufacturer: [place]   Lot: [id number]   [place]: [id number]

## 2019-12-19 ENCOUNTER — Other Ambulatory Visit: Payer: Self-pay | Admitting: Cardiology

## 2019-12-19 DIAGNOSIS — R931 Abnormal findings on diagnostic imaging of heart and coronary circulation: Secondary | ICD-10-CM

## 2020-01-25 ENCOUNTER — Other Ambulatory Visit: Payer: Self-pay | Admitting: Cardiology

## 2020-01-25 ENCOUNTER — Encounter: Payer: Self-pay | Admitting: Cardiology

## 2020-01-25 DIAGNOSIS — R931 Abnormal findings on diagnostic imaging of heart and coronary circulation: Secondary | ICD-10-CM

## 2020-01-25 DIAGNOSIS — R002 Palpitations: Secondary | ICD-10-CM

## 2020-01-25 MED ORDER — METOPROLOL TARTRATE 25 MG PO TABS: 25.0000 mg | ORAL_TABLET | Freq: Two times a day (BID) | ORAL | 3 refills | Status: DC

## 2020-01-25 NOTE — Progress Notes (Signed)
ON-CALL CARDIOLOGY 01/25/20  Patient's name: Jenny Henderson.   MRN: [id number].    DOB: [birth year] Primary care provider: Aletha Halim., PA-C. Cardiologist: Dr. Virgina Jock  Interaction regarding this patient's care today: Patient called to have her metoprolol refilled.   Rex Kras, DO, [facility]. [place] Office: [phone]

## 2020-05-12 ENCOUNTER — Ambulatory Visit
Admission: RE | Admit: 2020-05-12 | Discharge: 2020-05-12 | Disposition: A | Discharge status: Home / Self Care | Payer: Medicare HMO | Source: Ambulatory Visit | Attending: Physician Assistant | Admitting: Physician Assistant

## 2020-05-12 ENCOUNTER — Other Ambulatory Visit: Payer: Self-pay | Admitting: Physician Assistant

## 2020-05-12 DIAGNOSIS — R1031 Right lower quadrant pain: Secondary | ICD-10-CM

## 2020-05-13 ENCOUNTER — Emergency Department (HOSPITAL_COMMUNITY): Payer: Medicare HMO | Admitting: Certified Registered"

## 2020-05-13 ENCOUNTER — Other Ambulatory Visit: Payer: Self-pay

## 2020-05-13 ENCOUNTER — Encounter (HOSPITAL_COMMUNITY): Payer: Self-pay | Admitting: *Deleted

## 2020-05-13 ENCOUNTER — Encounter (HOSPITAL_COMMUNITY): Admission: EM | Disposition: A | Discharge status: Home / Self Care | Payer: Self-pay | Source: Home / Self Care

## 2020-05-13 ENCOUNTER — Inpatient Hospital Stay (HOSPITAL_COMMUNITY)
Admission: EM | Admit: 2020-05-13 | Discharge: 2020-05-16 | DRG: 349 | Disposition: A | Discharge status: Home / Self Care | Payer: Medicare HMO | Attending: Surgery | Admitting: Surgery

## 2020-05-13 DIAGNOSIS — K219 Gastro-esophageal reflux disease without esophagitis: Secondary | ICD-10-CM | POA: Diagnosis present

## 2020-05-13 DIAGNOSIS — K660 Peritoneal adhesions (postprocedural) (postinfection): Secondary | ICD-10-CM | POA: Diagnosis present

## 2020-05-13 DIAGNOSIS — Z20822 Contact with and (suspected) exposure to covid-19: Secondary | ICD-10-CM | POA: Diagnosis present

## 2020-05-13 DIAGNOSIS — Z8249 Family history of ischemic heart disease and other diseases of the circulatory system: Secondary | ICD-10-CM | POA: Diagnosis not present

## 2020-05-13 DIAGNOSIS — Z820 Family history of epilepsy and other diseases of the nervous system: Secondary | ICD-10-CM

## 2020-05-13 DIAGNOSIS — Z8719 Personal history of other diseases of the digestive system: Secondary | ICD-10-CM | POA: Diagnosis not present

## 2020-05-13 DIAGNOSIS — I080 Rheumatic disorders of both mitral and aortic valves: Secondary | ICD-10-CM | POA: Diagnosis present

## 2020-05-13 DIAGNOSIS — R1031 Right lower quadrant pain: Secondary | ICD-10-CM | POA: Diagnosis present

## 2020-05-13 DIAGNOSIS — I2510 Atherosclerotic heart disease of native coronary artery without angina pectoris: Secondary | ICD-10-CM | POA: Diagnosis present

## 2020-05-13 DIAGNOSIS — K631 Perforation of intestine (nontraumatic): Secondary | ICD-10-CM | POA: Diagnosis present

## 2020-05-13 DIAGNOSIS — E785 Hyperlipidemia, unspecified: Secondary | ICD-10-CM | POA: Diagnosis present

## 2020-05-13 DIAGNOSIS — Z823 Family history of stroke: Secondary | ICD-10-CM

## 2020-05-13 DIAGNOSIS — R1030 Lower abdominal pain, unspecified: Secondary | ICD-10-CM

## 2020-05-13 DIAGNOSIS — Z800 Family history of malignant neoplasm of digestive organs: Secondary | ICD-10-CM | POA: Diagnosis not present

## 2020-05-13 DIAGNOSIS — K5712 Diverticulitis of small intestine without perforation or abscess without bleeding: Principal | ICD-10-CM | POA: Diagnosis present

## 2020-05-13 DIAGNOSIS — Z885 Allergy status to narcotic agent status: Secondary | ICD-10-CM | POA: Diagnosis not present

## 2020-05-13 DIAGNOSIS — K6389 Other specified diseases of intestine: Secondary | ICD-10-CM | POA: Diagnosis present

## 2020-05-13 HISTORY — PX: LAPAROSCOPIC APPENDECTOMY: SHX408

## 2020-05-13 LAB — COMPREHENSIVE METABOLIC PANEL
ALT: 19 U/L (ref 0–44)
AST: 20 U/L (ref 15–41)
Albumin: 4.3 g/dL (ref 3.5–5.0)
Alkaline Phosphatase: 74 U/L (ref 38–126)
Anion gap: 11 (ref 5–15)
BUN: 14 mg/dL (ref 8–23)
CO2: 27 mmol/L (ref 22–32)
Calcium: 9.5 mg/dL (ref 8.9–10.3)
Chloride: 102 mmol/L (ref 98–111)
Creatinine, Ser: 0.67 mg/dL (ref 0.44–1.00)
GFR calc Af Amer: >60 mL/min (ref >60)
GFR calc non Af Amer: >60 mL/min (ref >60)
Glucose, Bld: 114 mg/dL — ABNORMAL HIGH (ref 70–99)
Potassium: 4 mmol/L (ref 3.5–5.1)
Sodium: 140 mmol/L (ref 135–145)
Total Bilirubin: 0.8 mg/dL (ref 0.3–1.2)
Total Protein: 7.1 g/dL (ref 6.5–8.1)

## 2020-05-13 LAB — CBC
HCT: 41.4 % (ref 36.0–46.0)
Hemoglobin: 13.6 g/dL (ref 12.0–15.0)
MCH: 30.9 pg (ref 26.0–34.0)
MCHC: 32.9 g/dL (ref 30.0–36.0)
MCV: 94.1 fL (ref 80.0–100.0)
Platelets: 240 10*3/uL (ref 150–400)
RBC: 4.4 MIL/uL (ref 3.87–5.11)
RDW: 11.6 % (ref 11.5–15.5)
WBC: 11.4 10*3/uL — ABNORMAL HIGH (ref 4.0–10.5)
nRBC: 0 % (ref 0.0–0.2)

## 2020-05-13 LAB — URINALYSIS, ROUTINE W REFLEX MICROSCOPIC
Bilirubin Urine: NEGATIVE
Glucose, UA: NEGATIVE mg/dL
Ketones, ur: 5 mg/dL — AB
Nitrite: NEGATIVE
Protein, ur: NEGATIVE mg/dL
Specific Gravity, Urine: 1.017 (ref 1.005–1.030)
pH: 5 (ref 5.0–8.0)

## 2020-05-13 LAB — SARS CORONAVIRUS 2 BY RT PCR (HOSPITAL ORDER, PERFORMED IN ~~LOC~~ HOSPITAL LAB): SARS Coronavirus 2: NEGATIVE

## 2020-05-13 LAB — LIPASE, BLOOD: Lipase: 26 U/L (ref 11–51)

## 2020-05-13 SURGERY — APPENDECTOMY, LAPAROSCOPIC
Anesthesia: General | Site: Abdomen

## 2020-05-13 MED ORDER — ONDANSETRON HCL 4 MG/2ML IJ SOLN
4.0000 mg | Freq: Four times a day (QID) | INTRAMUSCULAR | Status: DC | PRN
  Administered 2020-05-13: 4 mg via INTRAVENOUS
  Filled 2020-05-13: qty 2

## 2020-05-13 MED ORDER — ONDANSETRON 4 MG PO TBDP: 4.0000 mg | ORAL_TABLET | Freq: Four times a day (QID) | ORAL | Status: DC | PRN

## 2020-05-13 MED ORDER — SUCCINYLCHOLINE CHLORIDE 200 MG/10ML IV SOSY
PREFILLED_SYRINGE | INTRAVENOUS | Status: AC
  Filled 2020-05-13: qty 10

## 2020-05-13 MED ORDER — DEXAMETHASONE SODIUM PHOSPHATE 10 MG/ML IJ SOLN
INTRAMUSCULAR | Status: AC
  Filled 2020-05-13: qty 1

## 2020-05-13 MED ORDER — FENTANYL CITRATE (PF) 100 MCG/2ML IJ SOLN
INTRAMUSCULAR | Status: AC
  Filled 2020-05-13: qty 2

## 2020-05-13 MED ORDER — PHENYLEPHRINE 40 MCG/ML (10ML) SYRINGE FOR IV PUSH (FOR BLOOD PRESSURE SUPPORT)
PREFILLED_SYRINGE | INTRAVENOUS | Status: AC
  Filled 2020-05-13: qty 10

## 2020-05-13 MED ORDER — ENOXAPARIN SODIUM 40 MG/0.4ML ~~LOC~~ SOLN
40.0000 mg | SUBCUTANEOUS | Status: DC
  Administered 2020-05-14 – 2020-05-16 (×3): 40 mg via SUBCUTANEOUS
  Filled 2020-05-13 (×3): qty 0.4

## 2020-05-13 MED ORDER — ACETAMINOPHEN 500 MG PO TABS
1000.0000 mg | ORAL_TABLET | Freq: Four times a day (QID) | ORAL | Status: DC
  Administered 2020-05-13 – 2020-05-16 (×8): 1000 mg via ORAL
  Filled 2020-05-13 (×9): qty 2

## 2020-05-13 MED ORDER — POLYETHYLENE GLYCOL 3350 17 G PO PACK
17.0000 g | PACK | Freq: Every day | ORAL | Status: DC | PRN
  Administered 2020-05-15: 17 g via ORAL
  Filled 2020-05-13: qty 1

## 2020-05-13 MED ORDER — FENTANYL CITRATE (PF) 100 MCG/2ML IJ SOLN
INTRAMUSCULAR | Status: DC | PRN
  Administered 2020-05-13 (×2): 50 ug via INTRAVENOUS

## 2020-05-13 MED ORDER — ROCURONIUM BROMIDE 10 MG/ML (PF) SYRINGE
PREFILLED_SYRINGE | INTRAVENOUS | Status: DC | PRN
  Administered 2020-05-13: 20 mg via INTRAVENOUS
  Administered 2020-05-13: 30 mg via INTRAVENOUS

## 2020-05-13 MED ORDER — 0.9 % SODIUM CHLORIDE (POUR BTL) OPTIME
TOPICAL | Status: DC | PRN
  Administered 2020-05-13: 1000 mL

## 2020-05-13 MED ORDER — MORPHINE SULFATE (PF) 2 MG/ML IV SOLN: 2.0000 mg | INTRAVENOUS | Status: DC | PRN

## 2020-05-13 MED ORDER — LIDOCAINE 2% (20 MG/ML) 5 ML SYRINGE
INTRAMUSCULAR | Status: DC | PRN
  Administered 2020-05-13: 60 mg via INTRAVENOUS

## 2020-05-13 MED ORDER — BUPIVACAINE-EPINEPHRINE (PF) 0.25% -1:200000 IJ SOLN
INTRAMUSCULAR | Status: AC
  Filled 2020-05-13: qty 30

## 2020-05-13 MED ORDER — ACETAMINOPHEN 500 MG PO TABS
1000.0000 mg | ORAL_TABLET | ORAL | Status: AC
  Administered 2020-05-13: 1000 mg via ORAL
  Filled 2020-05-13: qty 2

## 2020-05-13 MED ORDER — PROPOFOL 10 MG/ML IV BOLUS
INTRAVENOUS | Status: DC | PRN
  Administered 2020-05-13: 150 mg via INTRAVENOUS

## 2020-05-13 MED ORDER — SODIUM CHLORIDE 0.9 % IV BOLUS
1000.0000 mL | Freq: Once | INTRAVENOUS | Status: AC
  Administered 2020-05-13: 1000 mL via INTRAVENOUS

## 2020-05-13 MED ORDER — EPHEDRINE 5 MG/ML INJ
INTRAVENOUS | Status: AC
  Filled 2020-05-13: qty 10

## 2020-05-13 MED ORDER — DEXAMETHASONE SODIUM PHOSPHATE 10 MG/ML IJ SOLN
INTRAMUSCULAR | Status: DC | PRN
  Administered 2020-05-13: 10 mg via INTRAVENOUS

## 2020-05-13 MED ORDER — MIDAZOLAM HCL 2 MG/2ML IJ SOLN
INTRAMUSCULAR | Status: AC
  Filled 2020-05-13: qty 2

## 2020-05-13 MED ORDER — ROCURONIUM BROMIDE 10 MG/ML (PF) SYRINGE
PREFILLED_SYRINGE | INTRAVENOUS | Status: AC
  Filled 2020-05-13: qty 10

## 2020-05-13 MED ORDER — PIPERACILLIN-TAZOBACTAM 3.375 G IVPB 30 MIN
3.3750 g | Freq: Once | INTRAVENOUS | Status: AC
  Administered 2020-05-13: 3.375 g via INTRAVENOUS
  Filled 2020-05-13: qty 50

## 2020-05-13 MED ORDER — ONDANSETRON HCL 4 MG/2ML IJ SOLN: 4.0000 mg | Freq: Once | INTRAMUSCULAR | Status: DC | PRN

## 2020-05-13 MED ORDER — LACTATED RINGERS IV SOLN: INTRAVENOUS | Status: DC

## 2020-05-13 MED ORDER — GABAPENTIN 300 MG PO CAPS
300.0000 mg | ORAL_CAPSULE | ORAL | Status: AC
  Administered 2020-05-13: 300 mg via ORAL
  Filled 2020-05-13: qty 1

## 2020-05-13 MED ORDER — CHLORHEXIDINE GLUCONATE 0.12 % MT SOLN
15.0000 mL | Freq: Once | OROMUCOSAL | Status: AC
  Administered 2020-05-13: 15 mL via OROMUCOSAL

## 2020-05-13 MED ORDER — SIMETHICONE 80 MG PO CHEW: 40.0000 mg | CHEWABLE_TABLET | Freq: Four times a day (QID) | ORAL | Status: DC | PRN

## 2020-05-13 MED ORDER — OXYCODONE HCL 5 MG PO TABS
5.0000 mg | ORAL_TABLET | ORAL | Status: DC | PRN
  Filled 2020-05-13 (×2): qty 1

## 2020-05-13 MED ORDER — LIDOCAINE 2% (20 MG/ML) 5 ML SYRINGE
INTRAMUSCULAR | Status: AC
  Filled 2020-05-13: qty 5

## 2020-05-13 MED ORDER — KETOROLAC TROMETHAMINE 15 MG/ML IJ SOLN
15.0000 mg | INTRAMUSCULAR | Status: AC
  Administered 2020-05-13: 15 mg via INTRAVENOUS
  Filled 2020-05-13: qty 1

## 2020-05-13 MED ORDER — KCL IN DEXTROSE-NACL 10-5-0.45 MEQ/L-%-% IV SOLN
INTRAVENOUS | Status: DC
  Filled 2020-05-13 (×2): qty 1000

## 2020-05-13 MED ORDER — SODIUM CHLORIDE 0.9% FLUSH: 3.0000 mL | Freq: Once | INTRAVENOUS | Status: DC

## 2020-05-13 MED ORDER — TRAMADOL HCL 50 MG PO TABS
50.0000 mg | ORAL_TABLET | Freq: Four times a day (QID) | ORAL | Status: DC | PRN
  Administered 2020-05-14 (×2): 50 mg via ORAL
  Filled 2020-05-13 (×2): qty 1

## 2020-05-13 MED ORDER — PROPOFOL 10 MG/ML IV BOLUS
INTRAVENOUS | Status: AC
  Filled 2020-05-13: qty 20

## 2020-05-13 MED ORDER — FENTANYL CITRATE (PF) 100 MCG/2ML IJ SOLN
25.0000 ug | INTRAMUSCULAR | Status: DC | PRN
  Administered 2020-05-13 (×3): 50 ug via INTRAVENOUS

## 2020-05-13 MED ORDER — ONDANSETRON HCL 4 MG/2ML IJ SOLN
INTRAMUSCULAR | Status: AC
  Filled 2020-05-13: qty 2

## 2020-05-13 MED ORDER — METHOCARBAMOL 500 MG IVPB - SIMPLE MED
500.0000 mg | Freq: Four times a day (QID) | INTRAVENOUS | Status: DC | PRN
  Administered 2020-05-13: 500 mg via INTRAVENOUS
  Filled 2020-05-13: qty 500

## 2020-05-13 MED ORDER — RINGERS IRRIGATION IR SOLN
Status: DC | PRN
  Administered 2020-05-13: 1000 mL

## 2020-05-13 MED ORDER — GABAPENTIN 300 MG PO CAPS
300.0000 mg | ORAL_CAPSULE | Freq: Two times a day (BID) | ORAL | Status: DC
  Administered 2020-05-13 – 2020-05-16 (×6): 300 mg via ORAL
  Filled 2020-05-13 (×6): qty 1

## 2020-05-13 MED ORDER — ONDANSETRON HCL 4 MG/2ML IJ SOLN
INTRAMUSCULAR | Status: DC | PRN
  Administered 2020-05-13: 4 mg via INTRAVENOUS

## 2020-05-13 MED ORDER — MIDAZOLAM HCL 5 MG/5ML IJ SOLN
INTRAMUSCULAR | Status: DC | PRN
  Administered 2020-05-13: 1 mg via INTRAVENOUS

## 2020-05-13 MED ORDER — EPHEDRINE SULFATE-NACL 50-0.9 MG/10ML-% IV SOSY
PREFILLED_SYRINGE | INTRAVENOUS | Status: DC | PRN
  Administered 2020-05-13: 10 mg via INTRAVENOUS

## 2020-05-13 MED ORDER — METOPROLOL TARTRATE 5 MG/5ML IV SOLN: 5.0000 mg | Freq: Four times a day (QID) | INTRAVENOUS | Status: DC | PRN

## 2020-05-13 MED ORDER — KETOROLAC TROMETHAMINE 15 MG/ML IJ SOLN
15.0000 mg | Freq: Four times a day (QID) | INTRAMUSCULAR | Status: DC | PRN
  Administered 2020-05-13: 15 mg via INTRAVENOUS
  Filled 2020-05-13: qty 1

## 2020-05-13 MED ORDER — SUGAMMADEX SODIUM 200 MG/2ML IV SOLN
INTRAVENOUS | Status: DC | PRN
  Administered 2020-05-13: 150 mg via INTRAVENOUS

## 2020-05-13 MED ORDER — DIPHENHYDRAMINE HCL 12.5 MG/5ML PO ELIX: 12.5000 mg | ORAL_SOLUTION | Freq: Four times a day (QID) | ORAL | Status: DC | PRN

## 2020-05-13 MED ORDER — BUPIVACAINE-EPINEPHRINE 0.25% -1:200000 IJ SOLN
INTRAMUSCULAR | Status: DC | PRN
  Administered 2020-05-13: 20 mL

## 2020-05-13 MED ORDER — SCOPOLAMINE 1 MG/3DAYS TD PT72
1.0000 | MEDICATED_PATCH | TRANSDERMAL | Status: DC
  Administered 2020-05-13: 1.5 mg via TRANSDERMAL
  Filled 2020-05-13 (×2): qty 1

## 2020-05-13 MED ORDER — DIPHENHYDRAMINE HCL 50 MG/ML IJ SOLN: 12.5000 mg | Freq: Four times a day (QID) | INTRAMUSCULAR | Status: DC | PRN

## 2020-05-13 SURGICAL SUPPLY — 43 items
ADH SKN CLS APL DERMABOND .7 (GAUZE/BANDAGES/DRESSINGS) ×1
APL PRP STRL LF DISP 70% ISPRP (MISCELLANEOUS) ×1
APPLIER CLIP ROT 10 11.4 M/L (STAPLE)
APR CLP MED LRG 11.4X10 (STAPLE)
BAG SPEC RTRVL LRG 6X4 10 (ENDOMECHANICALS) ×1
CABLE HIGH FREQUENCY MONO STRZ (ELECTRODE) ×3 IMPLANT
CHLORAPREP W/TINT 26 (MISCELLANEOUS) ×3 IMPLANT
CLIP APPLIE ROT 10 11.4 M/L (STAPLE) IMPLANT
COVER SURGICAL LIGHT HANDLE (MISCELLANEOUS) ×3 IMPLANT
COVER WAND RF STERILE (DRAPES) IMPLANT
CUTTER FLEX LINEAR 45M (STAPLE) IMPLANT
DECANTER SPIKE VIAL GLASS SM (MISCELLANEOUS) ×3 IMPLANT
DERMABOND ADVANCED (GAUZE/BANDAGES/DRESSINGS) ×2
DERMABOND ADVANCED .7 DNX12 (GAUZE/BANDAGES/DRESSINGS) ×1 IMPLANT
ELECT REM PT RETURN 15FT ADLT (MISCELLANEOUS) ×3 IMPLANT
ENDOLOOP SUT PDS II  0 18 (SUTURE)
ENDOLOOP SUT PDS II 0 18 (SUTURE) IMPLANT
GAUZE SPONGE 4X4 12PLY STRL (GAUZE/BANDAGES/DRESSINGS) ×3 IMPLANT
GLOVE BIO SURGEON STRL SZ7.5 (GLOVE) ×3 IMPLANT
GOWN STRL REUS W/TWL XL LVL3 (GOWN DISPOSABLE) ×6 IMPLANT
KIT BASIN OR (CUSTOM PROCEDURE TRAY) ×3 IMPLANT
KIT TURNOVER KIT A (KITS) IMPLANT
PENCIL SMOKE EVACUATOR (MISCELLANEOUS) IMPLANT
POUCH SPECIMEN RETRIEVAL 10MM (ENDOMECHANICALS) ×3 IMPLANT
RELOAD 45 VASCULAR/THIN (ENDOMECHANICALS) IMPLANT
RELOAD PROXIMATE 75MM BLUE (ENDOMECHANICALS) ×9 IMPLANT
RELOAD STAPLE TA45 3.5 REG BLU (ENDOMECHANICALS) IMPLANT
SCISSORS LAP 5X35 DISP (ENDOMECHANICALS) ×3 IMPLANT
SET IRRIG TUBING LAPAROSCOPIC (IRRIGATION / IRRIGATOR) ×3 IMPLANT
SET TUBE SMOKE EVAC HIGH FLOW (TUBING) ×3 IMPLANT
SHEARS HARMONIC ACE PLUS 36CM (ENDOMECHANICALS) ×3 IMPLANT
STAPLER GUN LINEAR PROX 60 (STAPLE) ×3 IMPLANT
STAPLER PROXIMATE 75MM BLUE (STAPLE) ×3 IMPLANT
SUT MNCRL AB 4-0 PS2 18 (SUTURE) ×3 IMPLANT
SUT PDS AB 1 TP1 96 (SUTURE) ×6 IMPLANT
SUT SILK 2 0 SH CR/8 (SUTURE) ×6 IMPLANT
SUT SILK 3 0 SH CR/8 (SUTURE) ×3 IMPLANT
TOWEL OR 17X26 10 PK STRL BLUE (TOWEL DISPOSABLE) ×3 IMPLANT
TRAY FOLEY MTR SLVR 16FR STAT (SET/KITS/TRAYS/PACK) IMPLANT
TRAY LAPAROSCOPIC (CUSTOM PROCEDURE TRAY) ×3 IMPLANT
TROCAR BLADELESS OPT 5 100 (ENDOMECHANICALS) ×3 IMPLANT
TROCAR XCEL BLUNT TIP 100MML (ENDOMECHANICALS) ×3 IMPLANT
TROCAR XCEL NON-BLD 5MMX100MML (ENDOMECHANICALS) ×3 IMPLANT

## 2020-05-13 NOTE — H&P (Signed)
[facility] Admission Note  Jenny Henderson 12/27/1950  [id number].    Requesting MD: Roderic Palau Chief Complaint/Reason for Consult: RLQ pain   HPI:  Patient is a 69 year old female with PMH significant for Moderate aortic insufficiency, mitral regurgitation, HLD, non-obstructive CAD, GERD who presented to [facility] after CT as an outpatient yesterday which showed possible appendicitis. Patient has had abdominal pain since Saturday evening. Pain initially started in upper abdomen and was sharp in character and then localized more the RUQ. Pain exacerbated by standing and feels like there is something pulling in RLQ with this. Patient denies fever, chills, chest pain, SOB, nausea, vomiting, diarrhea or constipation. Past surgical hx includes laparoscopic cholecystectomy and cesarean section. Has nausea with codeine. She is retired. She has had both COVID vaccines and denies sick contacts.   ROS: Please see HPI, otherwise negative    Family History  Problem Relation Age of Onset  . Alzheimer's disease Mother        43 yrs  . CVA Mother 67  . Colon cancer Paternal Grandmother   . Other Father   . Heart attack Father   . Colon polyps Neg Hx   . Esophageal cancer Neg Hx   . Rectal cancer Neg Hx   . Stomach cancer Neg Hx     Past Medical History:  Diagnosis Date  . Allergy   . Anginal pain ([place]) 3/16   stress on chart- ? gallstones  . Cataract    removed left eyes  . Chest pain 01/27/2015   ETT-Myoview 4/16:  no ischemia, EF 60%  . Chest pain at rest   . Dizziness    saw Cardio 08-2019- mild leaky valves- started on Metoprolol, rosuvastatin, and ASA 81 mg- pvc's/pac's- since meds no further issues   . GERD (gastroesophageal reflux disease)   . Heart murmur   . History of kidney stones   . Hypoglycemia   . PONV (postoperative nausea and vomiting)     Past Surgical History:  Procedure Laterality Date  . CATARACT EXTRACTION     left eye  . CESAREAN SECTION    .  CHOLECYSTECTOMY N/A 05/13/2015   Procedure: LAPAROSCOPIC CHOLECYSTECTOMY WITH INTRAOPERATIVE CHOLANGIOGRAM;  Surgeon: Autumn Messing III, MD;  Location: [place];  Service: General;  Laterality: N/A;  . COLONOSCOPY    . EYE SURGERY    . OTOPLASTY     bilat, OD x 2  . POLYPECTOMY      Social History:  reports that she has never smoked. She has never used smokeless tobacco. She reports current alcohol use of about 2.0 standard drinks of alcohol per week. She reports that she does not use drugs.  Allergies:  Allergies  Allergen Reactions  . Codeine Nausea And Vomiting    (Not in a hospital admission)   Blood pressure 132/90, pulse 77, temperature 98.2 F (36.8 C), temperature source Oral, resp. rate 16, height 5\' 2"  (1.575 m), weight 57 kg, SpO2 100 %. Physical Exam:  General: pleasant, WD, WN white female who is laying in bed in NAD HEENT: Sclera are noninjected.  PERRL.  Ears and nose without any masses or lesions.  Mouth is pink and moist Heart: regular, rate, and rhythm.  Normal s1,s2. No obvious murmurs, gallops, or rubs noted.  Palpable radial and pedal pulses bilaterally Lungs: CTAB, no wheezes, rhonchi, or rales noted.  Respiratory effort nonlabored Abd: soft, ttp in RLQ, no guarding or peritonitis, ND, +BS, no masses, hernias, or organomegaly MS: all  4 extremities are symmetrical with no cyanosis, clubbing, or edema. Skin: warm and dry with no masses, lesions, or rashes Neuro: Cranial nerves 2-12 grossly intact, sensation is grossly intact throughout  Psych: A&Ox3 with an appropriate affect.    Results for orders placed or performed during the hospital encounter of 05/13/20 (from the past 48 hour(s))  Lipase, blood     Status: None   Collection Time: 05/13/20  9:53 AM  Result Value Ref Range   Lipase 26 11 - 51 U/L    Comment: Performed at [facility], [place] [address] [place], [place] [address]  Comprehensive metabolic panel     Status: Abnormal   Collection  Time: 05/13/20  9:53 AM  Result Value Ref Range   Sodium 140 135 - 145 mmol/L   Potassium 4.0 3.5 - 5.1 mmol/L   Chloride 102 98 - 111 mmol/L   CO2 27 22 - 32 mmol/L   Glucose, Bld 114 (H) 70 - 99 mg/dL    Comment: Glucose reference range applies only to samples taken after fasting for at least 8 hours.   BUN 14 8 - 23 mg/dL   Creatinine, Ser 0.67 0.44 - 1.00 mg/dL   Calcium 9.5 8.9 - 10.3 mg/dL   Total Protein 7.1 6.5 - 8.1 g/dL   Albumin 4.3 3.5 - 5.0 g/dL   AST 20 15 - 41 U/L   ALT 19 0 - 44 U/L   Alkaline Phosphatase 74 38 - 126 U/L   Total Bilirubin 0.8 0.3 - 1.2 mg/dL   GFR calc non Af Amer >60 >60 mL/min   GFR calc Af Amer >60 >60 mL/min   Anion gap 11 5 - 15    Comment: Performed at [facility], [place] [address] [place], [place] [address]  CBC     Status: Abnormal   Collection Time: 05/13/20  9:53 AM  Result Value Ref Range   WBC 11.4 (H) 4.0 - 10.5 K/uL   RBC 4.40 3.87 - 5.11 MIL/uL   Hemoglobin 13.6 12.0 - 15.0 g/dL   HCT 41.4 36 - 46 %   MCV 94.1 80.0 - 100.0 fL   MCH 30.9 26.0 - 34.0 pg   MCHC 32.9 30.0 - 36.0 g/dL   RDW 11.6 11.5 - 15.5 %   Platelets 240 150 - 400 K/uL   nRBC 0.0 0.0 - 0.2 %    Comment: Performed at [facility], [place] [address] [place], [place] [address]  Urinalysis, Routine w reflex microscopic     Status: Abnormal   Collection Time: 05/13/20  9:54 AM  Result Value Ref Range   Color, Urine YELLOW YELLOW   APPearance CLEAR CLEAR   Specific Gravity, Urine 1.017 1.005 - 1.030   pH 5.0 5.0 - 8.0   Glucose, UA NEGATIVE NEGATIVE mg/dL   Hgb urine dipstick SMALL (A) NEGATIVE   Bilirubin Urine NEGATIVE NEGATIVE   Ketones, ur 5 (A) NEGATIVE mg/dL   Protein, ur NEGATIVE NEGATIVE mg/dL   Nitrite NEGATIVE NEGATIVE   Leukocytes,Ua SMALL (A) NEGATIVE   RBC / HPF 0-5 0 - 5 RBC/hpf   WBC, UA 0-5 0 - 5 WBC/hpf   Bacteria, UA FEW (A) NONE SEEN   Mucus PRESENT     Comment: Performed at [facility], [place] [address] [place], [place] [address]   CT ABDOMEN PELVIS WO CONTRAST  Result Date: 05/12/2020 CLINICAL DATA:  Acute right lower quadrant abdominal pain. EXAM: CT  ABDOMEN AND PELVIS WITHOUT CONTRAST TECHNIQUE: Multidetector CT imaging of the abdomen and pelvis was performed following the standard protocol without IV contrast. COMPARISON:  None. FINDINGS: Lower chest: No acute abnormality. Hepatobiliary: No focal liver abnormality is seen. Status post cholecystectomy. No biliary dilatation. Pancreas: Unremarkable. No pancreatic ductal dilatation or surrounding inflammatory changes. Spleen: Normal in size without focal abnormality. Adrenals/Urinary Tract: Adrenal glands appear normal. Small nonobstructive right renal calculus is noted. No hydronephrosis or renal obstruction is noted. Urinary bladder is unremarkable. Stomach/Bowel: The stomach is unremarkable. There is a large inflamed structure seen in the right lower quadrant with maximum measured diameter of 18 mm with surrounding inflammation. This most likely represents appendicitis, although inflamed Meckel's diverticulum cannot be excluded. It is difficult to determine exactly where this abnormality arises given the patient's redundant colon, whether distal small bowel or cecum. Vascular/Lymphatic: No significant vascular abnormality is noted. Mildly enlarged mesenteric lymph nodes are noted in the right lower quadrant most likely inflammatory in etiology. Reproductive: Uterus and bilateral adnexa are unremarkable. Other: No abdominal wall hernia or abnormality. No abdominopelvic ascites. Musculoskeletal: No acute or significant osseous findings. IMPRESSION: 1. Large inflamed structure is seen in the right lower quadrant with maximum measured diameter of 18 mm. This may represent acute appendicitis, although inflamed small bowel or Meckel's diverticulum cannot be excluded. It is difficult to determine exactly where this abnormality arises  given the patient's redundant colon, whether small bowel or cecum. Mildly enlarged mesenteric lymph nodes are noted in the right lower quadrant most likely inflammatory in etiology. These results will be called to the ordering clinician or representative by the Radiologist Assistant, and communication documented in the PACS or zVision Dashboard. 2. Small nonobstructive right renal calculus. No hydronephrosis or renal obstruction is noted. Electronically Signed   By: Marijo Conception M.D.   On: 05/12/2020 16:38      Assessment/Plan RLQ Pain Probable appendicitis  - WBC 11.4, afebrile and VSS - ttp in RLQ without peritonitis - CT from yesterday shows large inflamed structure (43mm) in RLQ - appendicitis vs Meckel's  - recent colonoscopy in 11/2019 - polyps with tubular adenoma but no comment on any abnormality of appendiceal orifice or concern for malignancy - plan for diagnostic laparoscopy and probably appendectomy this evening    Admit to observation, possibly home tomorrow AM pending operative findings.   Norm Parcel, [facility] 05/13/2020, 2:06 PM Please see Amion for pager number during day hours 7:00am-4:30pm

## 2020-05-13 NOTE — Transfer of Care (Signed)
Immediate Anesthesia Transfer of Care Note  Patient: Jenny Henderson  Procedure(s) Performed: DIAGNOSTIC LAPAROSCOPY SMALL BOWEL RESECTION (N/A Abdomen)  Patient Location: PACU  Anesthesia Type:General  Level of Consciousness: awake, alert  and patient cooperative  Airway & Oxygen Therapy: Patient Spontanous Breathing and Patient connected to face mask oxygen  Post-op Assessment: Report given to RN and Post -op Vital signs reviewed and stable  Post vital signs: Reviewed and stable  Last Vitals:  Vitals Value Taken Time  BP 121/92 05/13/20 1910  Temp    Pulse 70 05/13/20 1913  Resp 11 05/13/20 1913  SpO2 100 % 05/13/20 1913  Vitals shown include unvalidated device data.  Last Pain:  Vitals:   05/13/20 0933  TempSrc:   PainSc: 4          Complications: No complications documented.

## 2020-05-13 NOTE — Anesthesia Procedure Notes (Signed)
Procedure Name: Intubation Date/Time: 05/13/2020 5:52 PM Performed by: Gerald Leitz, CRNA Pre-anesthesia Checklist: Patient identified, Patient being monitored, Timeout performed, Emergency Drugs available and Suction available Patient Re-evaluated:Patient Re-evaluated prior to induction Oxygen Delivery Method: Circle system utilized Preoxygenation: Pre-oxygenation with 100% oxygen Induction Type: IV induction Ventilation: Mask ventilation without difficulty Laryngoscope Size: Mac and 3 Grade View: Grade I Tube type: Oral Tube size: 7.0 mm Number of attempts: 1 Airway Equipment and Method: Stylet Placement Confirmation: ETT inserted through vocal cords under direct vision,  positive ETCO2 and breath sounds checked- equal and bilateral Secured at: 21 cm Tube secured with: Tape Dental Injury: Teeth and Oropharynx as per pre-operative assessment

## 2020-05-13 NOTE — ED Triage Notes (Addendum)
Pt states she has had abd pain since Sat, Had a CT scan yesterday due to pain at [place]. No N/V/D Pt states CT showed a mass in rt abd

## 2020-05-13 NOTE — ED Provider Notes (Signed)
[facility] DEPT Provider Note   CSN: [id number] Arrival date & time: 05/13/20  2440     History Chief Complaint  Patient presents with  . Abdominal Pain    Jenny Henderson is a 69 y.o. female.  Patient complains of abdominal pain.  Patient states she has been hurting since Saturday.  He had a CT scan done yesterday by her primary care doctor which suggest appendicitis.  She continues to have pain  The history is provided by the patient. No language interpreter was used.  Abdominal Pain Pain location:  RLQ Pain quality: aching   Pain radiates to:  Does not radiate Pain severity:  Moderate Onset quality:  Sudden Timing:  Constant Progression:  Waxing and waning Chronicity:  New Context: not alcohol use   Relieved by:  Nothing Worsened by:  Nothing Associated symptoms: no chest pain, no cough, no diarrhea, no fatigue and no hematuria        Past Medical History:  Diagnosis Date  . Allergy   . Anginal pain ([place]) 3/16   stress on chart- ? gallstones  . Cataract    removed left eyes  . Chest pain 01/27/2015   ETT-Myoview 4/16:  no ischemia, EF 60%  . Chest pain at rest   . Dizziness    saw Cardio 08-2019- mild leaky valves- started on Metoprolol, rosuvastatin, and ASA 81 mg- pvc's/pac's- since meds no further issues   . GERD (gastroesophageal reflux disease)   . Heart murmur   . History of kidney stones   . Hypoglycemia   . PONV (postoperative nausea and vomiting)     Patient Active Problem List   Diagnosis Date Noted  . Coronary artery disease involving native coronary artery of native heart without angina pectoris 12/08/2019  . Elevated coronary artery calcium score 12/08/2019  . Nonrheumatic mitral valve regurgitation 12/08/2019  . Nonrheumatic aortic valve insufficiency 12/08/2019  . Abdominal pain, epigastric 10/10/2019  . Acute infection of nasal sinus 10/10/2019  . Biliary calculi 10/10/2019  . Esophagitis, reflux  10/10/2019  . Hypercholesterolemia without hypertriglyceridemia 10/10/2019  . Need for vaccination 10/10/2019  . OP (osteoporosis) 10/10/2019  . PONV (postoperative nausea and vomiting)   . Palpitations 08/28/2019  . Murmur 08/28/2019  . Precordial pain 08/28/2019  . Dyslipidemia 08/28/2019  . Breast lump 11/21/2018  . Seasonal allergic rhinitis 03/29/2018  . Chest pain at rest 01/27/2015  . GERD (gastroesophageal reflux disease) 01/27/2015    Past Surgical History:  Procedure Laterality Date  . CATARACT EXTRACTION     left eye  . CESAREAN SECTION    . CHOLECYSTECTOMY N/A 05/13/2015   Procedure: LAPAROSCOPIC CHOLECYSTECTOMY WITH INTRAOPERATIVE CHOLANGIOGRAM;  Surgeon: Autumn Messing III, MD;  Location: [facility];  Service: General;  Laterality: N/A;  . COLONOSCOPY    . EYE SURGERY    . OTOPLASTY     bilat, OD x 2  . POLYPECTOMY       OB History   No obstetric history on file.     Family History  Problem Relation Age of Onset  . Alzheimer's disease Mother        74 yrs  . CVA Mother 25  . Colon cancer Paternal Grandmother   . Other Father   . Heart attack Father   . Colon polyps Neg Hx   . Esophageal cancer Neg Hx   . Rectal cancer Neg Hx   . Stomach cancer Neg Hx     Social History   Tobacco  Use  . Smoking status: Never Smoker  . Smokeless tobacco: Never Used  Vaping Use  . Vaping Use: Never used  Substance Use Topics  . Alcohol use: Yes    Alcohol/week: 2.0 standard drinks    Types: 2 Glasses of wine per week    Comment: occossional   . Drug use: No    Home Medications Prior to Admission medications   Medication Sig Start Date End Date Taking? Authorizing Provider  acetaminophen (TYLENOL) 500 MG tablet Take 500 mg by mouth every 6 (six) hours as needed.    [provider]  Apoaequorin (PREVAGEN) 10 MG CAPS Take 1 capsule by mouth daily.    [provider]  aspirin EC 81 MG tablet Take 1 tablet (81 mg total) by mouth daily. 09/19/19    Patwardhan, Reynold Bowen, MD  carboxymethylcellulose (REFRESH PLUS) 0.5 % SOLN Place 1 drop into the left eye 2 (two) times daily as needed.    [provider]  fluticasone (FLONASE) 50 MCG/ACT nasal spray Place 1 spray into both nostrils at bedtime.     [provider]  metoprolol tartrate (LOPRESSOR) 25 MG tablet Take 1 tablet (25 mg total) by mouth 2 (two) times daily. Hold if systolic blood pressure (top blood pressure number) less than 100 mmHg or heart rate less than 60 bpm (pulse). 01/25/20 01/24/21  Rex Kras, DO  NON FORMULARY Vita fusion gummy    [provider]  Probiotic Product (PRO-BIOTIC BLEND PO) Take 1 capsule by mouth daily. 60 billion BTU    [provider]  rosuvastatin (CRESTOR) 10 MG tablet TAKE 1 TABLET BY MOUTH EVERY DAY 12/20/19   Patwardhan, Reynold Bowen, MD    Allergies    Codeine  Review of Systems   Review of Systems  Constitutional: Negative for appetite change and fatigue.  HENT: Negative for congestion, ear discharge and sinus pressure.   Eyes: Negative for discharge.  Respiratory: Negative for cough.   Cardiovascular: Negative for chest pain.  Gastrointestinal: Positive for abdominal pain. Negative for diarrhea.  Genitourinary: Negative for frequency and hematuria.  Musculoskeletal: Negative for back pain.  Skin: Negative for rash.  Neurological: Negative for seizures and headaches.  Psychiatric/Behavioral: Negative for hallucinations.    Physical Exam Updated Vital Signs BP 132/90 (BP Location: Right Arm)   Pulse 77   Temp 98.2 F (36.8 C) (Oral)   Resp 16   Ht 5\' 2"  (1.575 m)   Wt 57 kg   SpO2 100%   BMI 22.97 kg/m   Physical Exam Vitals and nursing note reviewed.  Constitutional:      Appearance: She is well-developed.  HENT:     Head: Normocephalic.     Nose: Nose normal.  Eyes:     General: No scleral icterus.    Conjunctiva/sclera: Conjunctivae normal.  Neck:     Thyroid: No thyromegaly.   Cardiovascular:     Rate and Rhythm: Normal rate and regular rhythm.     Heart sounds: No murmur heard.  No friction rub. No gallop.   Pulmonary:     Breath sounds: No stridor. No wheezing or rales.  Chest:     Chest wall: No tenderness.  Abdominal:     General: There is no distension.     Tenderness: There is abdominal tenderness. There is no rebound.  Musculoskeletal:        General: Normal range of motion.     Cervical back: Neck supple.  Lymphadenopathy:  Cervical: No cervical adenopathy.  Skin:    Findings: No erythema or rash.  Neurological:     Mental Status: She is oriented to person, place, and time.     Motor: No abnormal muscle tone.     Coordination: Coordination normal.  Psychiatric:        Behavior: Behavior normal.     ED Results / Procedures / Treatments   Labs (all labs ordered are listed, but only abnormal results are displayed) Labs Reviewed  COMPREHENSIVE METABOLIC PANEL - Abnormal; Notable for the following components:      Result Value   Glucose, Bld 114 (*)    All other components within normal limits  CBC - Abnormal; Notable for the following components:   WBC 11.4 (*)    All other components within normal limits  URINALYSIS, ROUTINE W REFLEX MICROSCOPIC - Abnormal; Notable for the following components:   Hgb urine dipstick SMALL (*)    Ketones, ur 5 (*)    Leukocytes,Ua SMALL (*)    Bacteria, UA FEW (*)    All other components within normal limits  SARS CORONAVIRUS 2 BY RT PCR (HOSPITAL ORDER, [facility])  LIPASE, BLOOD    EKG None  Radiology CT ABDOMEN PELVIS WO CONTRAST  Result Date: 05/12/2020 CLINICAL DATA:  Acute right lower quadrant abdominal pain. EXAM: CT ABDOMEN AND PELVIS WITHOUT CONTRAST TECHNIQUE: Multidetector CT imaging of the abdomen and pelvis was performed following the standard protocol without IV contrast. COMPARISON:  None. FINDINGS: Lower chest: No acute abnormality. Hepatobiliary: No  focal liver abnormality is seen. Status post cholecystectomy. No biliary dilatation. Pancreas: Unremarkable. No pancreatic ductal dilatation or surrounding inflammatory changes. Spleen: Normal in size without focal abnormality. Adrenals/Urinary Tract: Adrenal glands appear normal. Small nonobstructive right renal calculus is noted. No hydronephrosis or renal obstruction is noted. Urinary bladder is unremarkable. Stomach/Bowel: The stomach is unremarkable. There is a large inflamed structure seen in the right lower quadrant with maximum measured diameter of 18 mm with surrounding inflammation. This most likely represents appendicitis, although inflamed Meckel's diverticulum cannot be excluded. It is difficult to determine exactly where this abnormality arises given the patient's redundant colon, whether distal small bowel or cecum. Vascular/Lymphatic: No significant vascular abnormality is noted. Mildly enlarged mesenteric lymph nodes are noted in the right lower quadrant most likely inflammatory in etiology. Reproductive: Uterus and bilateral adnexa are unremarkable. Other: No abdominal wall hernia or abnormality. No abdominopelvic ascites. Musculoskeletal: No acute or significant osseous findings. IMPRESSION: 1. Large inflamed structure is seen in the right lower quadrant with maximum measured diameter of 18 mm. This may represent acute appendicitis, although inflamed small bowel or Meckel's diverticulum cannot be excluded. It is difficult to determine exactly where this abnormality arises given the patient's redundant colon, whether small bowel or cecum. Mildly enlarged mesenteric lymph nodes are noted in the right lower quadrant most likely inflammatory in etiology. These results will be called to the ordering clinician or representative by the Radiologist Assistant, and communication documented in the PACS or zVision Dashboard. 2. Small nonobstructive right renal calculus. No hydronephrosis or renal obstruction  is noted. Electronically Signed   By: Marijo Conception M.D.   On: 05/12/2020 16:38    Procedures Procedures (including critical care time)  Medications Ordered in ED Medications  sodium chloride flush (NS) 0.9 % injection 3 mL (has no administration in time range)  piperacillin-tazobactam (ZOSYN) IVPB 3.375 g (3.375 g Intravenous New Bag/Given 05/13/20 1356)  sodium chloride  0.9 % bolus 1,000 mL (1,000 mLs Intravenous New Bag/Given 05/13/20 1355)    ED Course  I have reviewed the triage vital signs and the nursing notes.  Pertinent labs & imaging results that were available during my care of the patient were reviewed by me and considered in my medical decision making (see chart for details).    MDM Rules/Calculators/A&P Pt with possible appdx.  General surgery to see pt         This patient presents to the ED for concern of abdominal pain this involvesabdominal extensive number of treatment options, and is a complaint that carries with it a high risk of complications and morbidity.  The differential diagnosis includes appdx   Lab Tests:   I Ordered, reviewed, and interpreted labs, which includedcbc, cmet,  Elevated wbc  Medicines ordered:   I ordered medication zosyn for infection  Imaging Studies ordered:   I ordered imaging studies which included ct abd and  I independently visualized and interpreted imaging which showed ? appendicitis  Additional history obtained:   Additional history obtained fromrecords  Previous records obtained and reviewed,  Consultations Obtained:   I consulted surgery  and discussed lab and imaging findings  Reevaluation:  After the interventions stated above, I reevaluated the patient and found unchanged  Critical Interventions:  .  Patient with right  Final Clinical Impression(s) / ED Diagnoses Final diagnoses:  Lower abdominal pain    Rx / DC Orders ED Discharge Orders    None       Milton Ferguson,  MD 05/13/20 1408

## 2020-05-13 NOTE — Anesthesia Preprocedure Evaluation (Addendum)
Anesthesia Evaluation  Patient identified by MRN, date of birth, ID band Patient awake    Reviewed: Allergy & Precautions, NPO status , Patient's Chart, lab work & pertinent test results  History of Anesthesia Complications (+) PONV and history of anesthetic complications  Airway Mallampati: I  TM Distance: >3 FB Neck ROM: Full    Dental  (+) Chipped,    Pulmonary neg pulmonary ROS,    Pulmonary exam normal breath sounds clear to auscultation       Cardiovascular + CAD  Normal cardiovascular exam+ Valvular Problems/Murmurs AI and MR  Rhythm:Regular Rate:Normal  ECG: SR, rate 64  ECHO: Echocardiogram 08/30/2019: Left ventricle cavity is normal in size. Mild concentric hypertrophy of the left ventricle. Normal LV systolic function with EF 55%. Normal global  wall motion. Diastolic function assessment limited due to severity of mitral regurgitation. Trileaflet aortic valve. Mild aortic valve leaflet thickening. Trace aortic valve stenosis. Moderate (Grade II) aortic regurgitation. Moderate (Grade III) mitral regurgitation. Moderate mitral valve leaflet thickening. Mild tricuspid regurgitation. Estimated pulmonary artery systolic pressure is 21 mmHg.   Neuro/Psych negative neurological ROS  negative psych ROS   GI/Hepatic Neg liver ROS, GERD  Medicated and Controlled,  Endo/Other  negative endocrine ROS  Renal/GU negative Renal ROS     Musculoskeletal negative musculoskeletal ROS (+)   Abdominal   Peds  Hematology HLD   Anesthesia Other Findings right lower quadrant pain  Reproductive/Obstetrics                           Anesthesia Physical Anesthesia Plan  ASA: III  Anesthesia Plan: General   Post-op Pain Management:    Induction: Intravenous  PONV Risk Score and Plan: 4 or greater and Ondansetron, Dexamethasone, Propofol infusion and Treatment may vary due to age or medical  condition  Airway Management Planned: Oral ETT  Additional Equipment:   Intra-op Plan:   Post-operative Plan: Extubation in OR  Informed Consent: I have reviewed the patients History and Physical, chart, labs and discussed the procedure including the risks, benefits and alternatives for the proposed anesthesia with the patient or authorized representative who has indicated his/her understanding and acceptance.     Dental advisory given  Plan Discussed with: CRNA  Anesthesia Plan Comments:        Anesthesia Quick Evaluation

## 2020-05-13 NOTE — Op Note (Signed)
05/13/2020  7:05 PM  PATIENT:  Jenny Henderson  69 y.o. female  PRE-OPERATIVE DIAGNOSIS:  right lower quadrant pain  POST-OPERATIVE DIAGNOSIS:  right lower quadrant pain  PROCEDURE:  Procedure(s): DIAGNOSTIC LAPAROSCOPY, SMALL BOWEL RESECTION (N/A)  SURGEON:  Surgeon(s) and Role:    * Jovita Kussmaul, MD - Primary  PHYSICIAN ASSISTANT:   ASSISTANTS: none   ANESTHESIA:   general  EBL:  50 mL   BLOOD ADMINISTERED:none  DRAINS: none   LOCAL MEDICATIONS USED:  MARCAINE     SPECIMEN:  Source of Specimen:  segment of small intestine  DISPOSITION OF SPECIMEN:  PATHOLOGY  COUNTS:  YES  TOURNIQUET:  * No tourniquets in log *  DICTATION: .Dragon Dictation   After informed consent was obtained the patient was brought to the operating room and placed in the supine position on the operating table.  After adequate induction of general anesthesia the patient's abdomen was prepped with ChloraPrep, allowed to dry, and draped in usual sterile manner.  An appropriate timeout was performed.  The area below the umbilicus was infiltrated with quarter percent Marcaine.  A small incision was made with a 15 blade knife.  The incision was carried through the skin and subcutaneous tissue bluntly with a hemostat until the linea alba was identified.  The linea alba was incised with the 15 blade knife.  Each side was grasped with Kocher clamps and elevated anteriorly.  The preperitoneal space was probed bluntly with a hemostat until the peritoneum was opened and access was gained to the abdominal cavity.  A 0 Vicryl pursestring stitch was placed in the fascia surrounding the opening.  A Hassan cannula was placed through the opening and anchored in place with the pursestring stitch.  The abdomen was then insufflated with carbon dioxide without difficulty.  The camera was placed through the [facility] cannula and there were no adhesions to the anterior abdominal wall.  The suprapubic area was then infiltrated  with quarter percent Marcaine.  A small stab incision was made with a 15 blade knife.  A 5 mm port was placed bluntly through the incision into the abdominal cavity under direct vision.  Another 5 mm port was placed in the left upper quadrant under direct vision in a similar manner.  The camera was moved to the suprapubic port.  The right lower quadrant was inspected.  The cecum and appendix were normal.  The appendix was actually very small with no signs of inflammation.  The small bowel was then run from the ileocecal valve backwards.  As we ran the bowel backwards more proximally we did run into an area of severe inflammation with abscess formation in the distal small bowel.  It appeared to be perforated at the apex of the area of inflammation.  This had an appearance of a possible Meckel's diverticulum although the end of it was somewhat cystic appearing.  At this point the lower midline incision was extended slightly.  The small bowel was then brought out of the abdominal cavity through the small incision.  An area above and below of the affected area where the small bowel appeared normal was chosen for division of the small bowel.  The mesentery to each of these points was opened sharply with the electrocautery.  The small bowel was then divided proximally and distally to the area of inflammation and perforation by a single firing of the GIA-75 stapler at each site.  The mesentery to this segment was taken down sharply with  the harmonic scalpel and then oversewed with 2-0 silk stitches.  The small bowel segment causing the problem was then removed from the patient and marked with a stitch on the distal staple line and sent to pathology for further evaluation.  The proximal and distal segments of small intestine approximated each other easily.  A small opening was made on the antimesenteric end of each limb of small bowel.  Each limb of a GIA-75 stapler was then placed down the appropriate limb of small bowel,  clamped, and fired thereby creating a nice widely patent enteroenterostomy.  The common opening was closed with a single firing of a TA 60 stapler.  The staple line was then imbricated with multiple 2-0 silk Lembert stitches as well as a 2-0 silk crotch stitch.  The anastomosis was widely patent and appeared healthy.  It was allowed to drop back into the abdominal cavity.  The fascial defect was then closed with 2 running #1 double-stranded looped PDS sutures.  The abdomen was then insufflated again and irrigated with copious amounts of saline.  No other abnormalities were noted on general inspection of the abdomen.  At this point the gas was allowed to escape and the 5 mm ports were removed.  The two 5 mm incisions were closed with interrupted 4-0 Monocryl subcuticular stitches.  The midline wound was left open and packed with moistened 4 x 4 gauze.  Sterile dressings were then applied.  The patient tolerated the procedure well.  At the end of the case all needle sponge and instrument counts were correct.  The patient was then awakened and taken recovery in stable condition.  PLAN OF CARE: Admit to inpatient   PATIENT DISPOSITION:  PACU - hemodynamically stable.   Delay start of Pharmacological VTE agent (>24hrs) due to surgical blood loss or risk of bleeding: no

## 2020-05-13 NOTE — Anesthesia Postprocedure Evaluation (Signed)
Anesthesia Post Note  Patient: Jenny Henderson  Procedure(s) Performed: DIAGNOSTIC LAPAROSCOPY SMALL BOWEL RESECTION (N/A Abdomen)     Patient location during evaluation: PACU Anesthesia Type: General Level of consciousness: awake Pain management: pain level controlled Vital Signs Assessment: post-procedure vital signs reviewed and stable Respiratory status: spontaneous breathing, nonlabored ventilation, respiratory function stable and patient connected to nasal cannula oxygen Cardiovascular status: blood pressure returned to baseline and stable Postop Assessment: no apparent nausea or vomiting Anesthetic complications: no   No complications documented.  Last Vitals:  Vitals:   05/13/20 2015 05/13/20 2035  BP: 122/65 119/69  Pulse: 76 73  Resp: 15 16  Temp: 36.7 C 36.5 C  SpO2: 99% 100%    Last Pain:  Vitals:   05/13/20 2000  TempSrc:   PainSc: Asleep                 Marija Calamari P Vonita Calloway

## 2020-05-14 ENCOUNTER — Encounter (HOSPITAL_COMMUNITY): Payer: Self-pay | Admitting: General Surgery

## 2020-05-14 LAB — CBC
HCT: 35.8 % — ABNORMAL LOW (ref 36.0–46.0)
Hemoglobin: 12 g/dL (ref 12.0–15.0)
MCH: 30.8 pg (ref 26.0–34.0)
MCHC: 33.5 g/dL (ref 30.0–36.0)
MCV: 91.8 fL (ref 80.0–100.0)
Platelets: 222 10*3/uL (ref 150–400)
RBC: 3.9 MIL/uL (ref 3.87–5.11)
RDW: 11.5 % (ref 11.5–15.5)
WBC: 10.5 10*3/uL (ref 4.0–10.5)
nRBC: 0 % (ref 0.0–0.2)

## 2020-05-14 LAB — BASIC METABOLIC PANEL
Anion gap: 12 (ref 5–15)
BUN: 11 mg/dL (ref 8–23)
CO2: 21 mmol/L — ABNORMAL LOW (ref 22–32)
Calcium: 8.7 mg/dL — ABNORMAL LOW (ref 8.9–10.3)
Chloride: 102 mmol/L (ref 98–111)
Creatinine, Ser: 0.53 mg/dL (ref 0.44–1.00)
GFR calc Af Amer: >60 mL/min (ref >60)
GFR calc non Af Amer: >60 mL/min (ref >60)
Glucose, Bld: 219 mg/dL — ABNORMAL HIGH (ref 70–99)
Potassium: 3.9 mmol/L (ref 3.5–5.1)
Sodium: 135 mmol/L (ref 135–145)

## 2020-05-14 MED ORDER — FENTANYL CITRATE (PF) 100 MCG/2ML IJ SOLN: 12.5000 ug | INTRAMUSCULAR | Status: DC | PRN

## 2020-05-14 MED ORDER — PANTOPRAZOLE SODIUM 40 MG IV SOLR
40.0000 mg | Freq: Every day | INTRAVENOUS | Status: DC
  Administered 2020-05-14: 40 mg via INTRAVENOUS
  Filled 2020-05-14: qty 40

## 2020-05-14 MED ORDER — SODIUM CHLORIDE 0.9 % IV SOLN
INTRAVENOUS | Status: DC | PRN
  Administered 2020-05-14: 250 mL via INTRAVENOUS

## 2020-05-14 MED ORDER — PIPERACILLIN-TAZOBACTAM 3.375 G IVPB
3.3750 g | Freq: Three times a day (TID) | INTRAVENOUS | Status: DC
  Administered 2020-05-14 – 2020-05-15 (×4): 3.375 g via INTRAVENOUS
  Filled 2020-05-14 (×4): qty 50

## 2020-05-14 MED ORDER — ONDANSETRON HCL 4 MG/2ML IJ SOLN: 4.0000 mg | Freq: Four times a day (QID) | INTRAMUSCULAR | Status: DC | PRN

## 2020-05-14 MED ORDER — HEPARIN SODIUM (PORCINE) 5000 UNIT/ML IJ SOLN: 5000.0000 [IU] | Freq: Three times a day (TID) | INTRAMUSCULAR | Status: DC

## 2020-05-14 MED ORDER — ONDANSETRON 4 MG PO TBDP: 4.0000 mg | ORAL_TABLET | Freq: Four times a day (QID) | ORAL | Status: DC | PRN

## 2020-05-14 NOTE — Progress Notes (Addendum)
[facility] Progress Note  1 Day Post-Op  Subjective: CC:  Pain controlled. Denies flatus or BM. Denies nausea or emesis. Has been up to the bedside commode twice to urinate. At baseline mobilizes without an assistive device.  Objective: Vital signs in last 24 hours: Temp:  [97.7 F (36.5 C)-98.2 F (36.8 C)] 97.8 F (36.6 C) (07/15 0355) Pulse Rate:  [59-86] 59 (07/15 0355) Resp:  [14-18] 17 (07/15 0355) BP: (113-144)/(63-109) 120/64 (07/15 0355) SpO2:  [95 %-100 %] 98 % (07/15 0355) Weight:  [56 kg-57 kg] 57 kg (07/14 1235) Last BM Date: 05/12/20  Intake/Output from previous day: 07/14 0701 - 07/15 0700 In: 2300.2 [I.V.:2249.3; IV Piggyback:50.8] Out: 265 [Urine:215; Blood:50] Intake/Output this shift: No intake/output data recorded.  PE: Gen:  Alert, NAD, pleasant Card:  Regular rate and rhythm, pedal pulses 2+ BL Pulm:  Normal effort, clear to auscultation bilaterally Abd: Soft, approp tender, midline incision clean, gauze/tegaderm  Skin: warm and dry, no rashes  Psych: A&Ox3   Lab Results:  Recent Labs    05/13/20 0953 05/14/20 0335  WBC 11.4* 10.5  HGB 13.6 12.0  HCT 41.4 35.8*  PLT 240 222   BMET Recent Labs    05/13/20 0953 05/14/20 0335  NA 140 135  K 4.0 3.9  CL 102 102  CO2 27 21*  GLUCOSE 114* 219*  BUN 14 11  CREATININE 0.67 0.53  CALCIUM 9.5 8.7*   PT/INR No results for input(s): LABPROT, INR in the last 72 hours. CMP     Component Value Date/Time   NA 135 05/14/2020 0335   NA 140 09/11/2019 0835   K 3.9 05/14/2020 0335   CL 102 05/14/2020 0335   CO2 21 (L) 05/14/2020 0335   GLUCOSE 219 (H) 05/14/2020 0335   BUN 11 05/14/2020 0335   BUN 16 09/11/2019 0835   CREATININE 0.53 05/14/2020 0335   CALCIUM 8.7 (L) 05/14/2020 0335   PROT 7.1 05/13/2020 0953   ALBUMIN 4.3 05/13/2020 0953   AST 20 05/13/2020 0953   ALT 19 05/13/2020 0953   ALKPHOS 74 05/13/2020 0953   BILITOT 0.8 05/13/2020 0953   GFRNONAA >60 05/14/2020  0335   GFRAA >60 05/14/2020 0335   Lipase     Component Value Date/Time   LIPASE 26 05/13/2020 0953   Studies/Results: CT ABDOMEN PELVIS WO CONTRAST  Result Date: 05/12/2020 CLINICAL DATA:  Acute right lower quadrant abdominal pain. EXAM: CT ABDOMEN AND PELVIS WITHOUT CONTRAST TECHNIQUE: Multidetector CT imaging of the abdomen and pelvis was performed following the standard protocol without IV contrast. COMPARISON:  None. FINDINGS: Lower chest: No acute abnormality. Hepatobiliary: No focal liver abnormality is seen. Status post cholecystectomy. No biliary dilatation. Pancreas: Unremarkable. No pancreatic ductal dilatation or surrounding inflammatory changes. Spleen: Normal in size without focal abnormality. Adrenals/Urinary Tract: Adrenal glands appear normal. Small nonobstructive right renal calculus is noted. No hydronephrosis or renal obstruction is noted. Urinary bladder is unremarkable. Stomach/Bowel: The stomach is unremarkable. There is a large inflamed structure seen in the right lower quadrant with maximum measured diameter of 18 mm with surrounding inflammation. This most likely represents appendicitis, although inflamed Meckel's diverticulum cannot be excluded. It is difficult to determine exactly where this abnormality arises given the patient's redundant colon, whether distal small bowel or cecum. Vascular/Lymphatic: No significant vascular abnormality is noted. Mildly enlarged mesenteric lymph nodes are noted in the right lower quadrant most likely inflammatory in etiology. Reproductive: Uterus and bilateral adnexa are unremarkable. Other: No abdominal wall  hernia or abnormality. No abdominopelvic ascites. Musculoskeletal: No acute or significant osseous findings. IMPRESSION: 1. Large inflamed structure is seen in the right lower quadrant with maximum measured diameter of 18 mm. This may represent acute appendicitis, although inflamed small bowel or Meckel's diverticulum cannot be excluded.  It is difficult to determine exactly where this abnormality arises given the patient's redundant colon, whether small bowel or cecum. Mildly enlarged mesenteric lymph nodes are noted in the right lower quadrant most likely inflammatory in etiology. These results will be called to the ordering clinician or representative by the Radiologist Assistant, and communication documented in the PACS or zVision Dashboard. 2. Small nonobstructive right renal calculus. No hydronephrosis or renal obstruction is noted. Electronically Signed   By: Marijo Conception M.D.   On: 05/12/2020 16:38   Anti-infectives: Anti-infectives (From admission, onward)   Start     Dose/Rate Route Frequency Ordered Stop   05/14/20 0445  piperacillin-tazobactam (ZOSYN) IVPB 3.375 g     Discontinue     3.375 g 12.5 mL/hr over 240 Minutes Intravenous Every 8 hours 05/14/20 0439     05/13/20 1330  piperacillin-tazobactam (ZOSYN) IVPB 3.375 g        3.375 g 100 mL/hr over 30 Minutes Intravenous  Once 05/13/20 1328 05/13/20 1721       Assessment/Plan GERD - PPI  RLQ pain- PRN analgesics and antiemetics small bowel perforation/abscess, possible perforated meckel's diverticulum POD#1 s/p diagnostic laparoscopy, mini laparotomy, small bowel resection  7/14 Dr. Marlou Starks - AFVSS, WBC 10.5  - allow CLD and await further return of bowel function  - OOB, mobilize today - IS 10x q 1h  FEN: MIVF, CLD  VTE: SCD's, Lovenox  ID: Zosyn 7/14 >>, plan to D/C abx POD#3 (7/17)  Foley: none  Dispo: floor   LOS: 1 day    Obie Dredge, PA-C [facility] Please see Amion for pager number during day hours 7:00am-4:30pm

## 2020-05-14 NOTE — Discharge Summary (Addendum)
[facility] Discharge Summary   Patient ID: Jenny Henderson MRN: [id number] DOB/AGE: 05/18/1951 69 y.o.  Admit date: 05/13/2020 Discharge date: 05/16/2020  Admitting Diagnosis: Small bowel perforation  Discharge Diagnosis Patient Active Problem List   Diagnosis Date Noted  . RLQ abdominal pain 05/13/2020  . Small bowel perforation ([place]) 05/13/2020  . Coronary artery disease involving native coronary artery of native heart without angina pectoris 12/08/2019  . Elevated coronary artery calcium score 12/08/2019  . Nonrheumatic mitral valve regurgitation 12/08/2019  . Nonrheumatic aortic valve insufficiency 12/08/2019  . Abdominal pain, epigastric 10/10/2019  . Acute infection of nasal sinus 10/10/2019  . Biliary calculi 10/10/2019  . Esophagitis, reflux 10/10/2019  . Hypercholesterolemia without hypertriglyceridemia 10/10/2019  . Need for vaccination 10/10/2019  . OP (osteoporosis) 10/10/2019  . PONV (postoperative nausea and vomiting)   . Palpitations 08/28/2019  . Murmur 08/28/2019  . Precordial pain 08/28/2019  . Dyslipidemia 08/28/2019  . Breast lump 11/21/2018  . Seasonal allergic rhinitis 03/29/2018  . Chest pain at rest 01/27/2015  . GERD (gastroesophageal reflux disease) 01/27/2015    Imaging: CT ABDOMEN/PELVIS 05/12/20 IMPRESSION: 1. Large inflamed structure is seen in the right lower quadrant with maximum measured diameter of 18 mm. This may represent acute appendicitis, although inflamed small bowel or Meckel's diverticulum cannot be excluded. It is difficult to determine exactly where this abnormality arises given the patient's redundant colon, whether small bowel or cecum. Mildly enlarged mesenteric lymph nodes are noted in the right lower quadrant most likely inflammatory in etiology. These results will be called to the ordering clinician or representative by the Radiologist Assistant, and communication documented in the PACS or zVision  Dashboard. 2. Small nonobstructive right renal calculus. No hydronephrosis or renal obstruction is noted.  Procedures Dr. Autumn Messing Henderson (05/13/20) - DIAGNOSTIC LAPAROSCOPY, SMALL BOWEL RESECTION (N/A)  HPI:  Patient is a 69 year old female with PMH significant for Moderate aortic insufficiency, mitral regurgitation, HLD, non-obstructive CAD, GERD who presented to [facility] after CT as an outpatient yesterday which showed possible appendicitis. Patient has had abdominal pain since Saturday evening. Pain initially started in upper abdomen and was sharp in character and then localized more the RUQ. Pain exacerbated by standing and feels like there is something pulling in RLQ with this. Patient denies fever, chills, chest pain, SOB, nausea, vomiting, diarrhea or constipation. Past surgical hx includes laparoscopic cholecystectomy and cesarean section. Has nausea with codeine. She is retired. She has had both COVID vaccines and denies sick contacts.   Hospital Course:  Workup revealed a leukocytosis and above CT scan concerning for possible appendicitis vs inflamed meckel's diverticulum/small bowel.  Patient was admitted and underwent procedure listed above where she was found to have an area of significant inflammation with abscess of this distal small bowel.  Tolerated procedure well and was transferred to the floor.  Diet was advanced as tolerated.  On POD #3, the patient was voiding well, tolerating diet, ambulating well, pain well controlled, vital signs stable, incisions c/d/i and felt stable for discharge home.  Patient will follow up in our office in 2 weeks and knows to call with questions or concerns.  She will call to confirm appointment date/time.    Physical Exam: General:  Alert, NAD, pleasant, comfortable Abd:  Soft, ND, mild tenderness, incisions C/D/I, midline incision dressing dry and intact    Follow-up Information    Jovita Kussmaul, MD Follow up.   Specialty: General Surgery Why: our  office is scheduling you  for post-operative follow up. please call to confirm appointment date/time. Contact information: [address] [place] [address] [phone]               Signed: Obie Dredge, [facility] 05/14/2020, 3:25 PM

## 2020-05-14 NOTE — Progress Notes (Signed)
Pharmacy Antibiotic Note  Jenny Henderson is a 69 y.o. female admitted on 05/13/2020 with Peritonitis.  Pharmacy has been consulted for zosyn dosing.  Plan: Zosyn 3.375g IV q8h (4 hour infusion).  Height: 5\' 2"  (157.5 cm) Weight: 57 kg (125 lb 9.6 oz) IBW/kg (Calculated) : 50.1  Temp (24hrs), Avg:98 F (36.7 C), Min:97.7 F (36.5 C), Max:98.2 F (36.8 C)  Recent Labs  Lab 05/13/20 0953 05/14/20 0335  WBC 11.4* 10.5  CREATININE 0.67 0.53    Estimated Creatinine Clearance: 51.8 mL/min (by C-G formula based on SCr of 0.53 mg/dL).    Allergies  Allergen Reactions  . Codeine Nausea And Vomiting    Antimicrobials this admission: Zosyn 05/13/2020 >>   Dose adjustments this admission: -  Microbiology results: -  Thank you for allowing pharmacy to be a part of this patient's care.  Nani Skillern Crowford 05/14/2020 4:39 AM

## 2020-05-14 NOTE — Discharge Instructions (Signed)
[facility] _______[facility]  OPEN ABDOMINAL SURGERY: POST OP INSTRUCTIONS  Always review your discharge instruction sheet given to you by the facility where your surgery was performed. IF YOU HAVE DISABILITY OR FAMILY LEAVE FORMS, YOU MUST BRING THEM TO THE OFFICE FOR PROCESSING.   DO NOT GIVE THEM TO YOUR DOCTOR.  1. A  prescription for pain medication may be given to you upon discharge.  Take your pain medication as prescribed, if needed.  If narcotic pain medicine is not needed, then you may take acetaminophen (Tylenol) or ibuprofen (Advil) as needed. 2. Take your usually prescribed medications unless otherwise directed. If you need a refill on your pain medication, please contact your pharmacy.  They will contact our office to request authorization. Prescriptions will not be filled after 5 pm or on week-ends. 3. You should follow a light diet the first 24 hours after arrival home, such as soup and crackers, etc.  Be sure to include lots of fluids daily.  Resume your normal diet the day after surgery. 4.Most patients will experience some swelling and bruising around the umbilicus or in the groin and scrotum.  Ice packs and reclining will help.  Swelling and bruising can take several days to resolve.  6. It is common to experience some constipation if taking pain medication after surgery.  Increasing fluid intake and taking a stool softener (such as Colace) will usually help or prevent this problem from occurring.  A mild laxative (Milk of Magnesia or Miralax) should be taken according to package directions if there are no bowel movements after 48 hours. 7. Unless discharge instructions indicate otherwise, you may remove your bandages 24-48 hours after surgery, and you may shower at that time.  You may have steri-strips (small skin tapes) in place directly over the incision.  These strips should be left on the skin for 7-10 days.  If your surgeon used skin glue on the incision, you may  shower in 24 hours.  The glue will flake off over the next 2-3 weeks.  Any sutures or staples will be removed at the office during your follow-up visit. 8. ACTIVITIES:  You may resume regular (light) daily activities beginning the next day--such as daily self-care, walking, climbing stairs--gradually increasing activities as tolerated.  You may have sexual intercourse when it is comfortable.  Refrain from any heavy lifting or straining until approved by your doctor.  a.You may drive when you are no longer taking prescription pain medication, you can comfortably wear a seatbelt, and you can safely maneuver your car and apply brakes. b.RETURN TO WORK:   _____________________________________________  9.You should see your doctor in the office for a follow-up appointment approximately 2-3 weeks after your surgery.  Make sure that you call for this appointment within a day or two after you arrive home to insure a convenient appointment time. 10.OTHER INSTRUCTIONS: _________________________    _____________________________________  WHEN TO CALL YOUR DOCTOR: 1. Fever over 101.0 2. Inability to urinate 3. Nausea and/or vomiting 4. Extreme swelling or bruising 5. Continued bleeding from incision. 6. Increased pain, redness, or drainage from the incision  The clinic staff is available to answer your questions during regular business hours.  Please don't hesitate to call and ask to speak to one of the nurses for clinical concerns.  If you have a medical emergency, go to the nearest emergency room or call 911.  A surgeon from [facility] is always on call at the hospital   [address]  [address], [place], [place], [place]  [address] ?  [address], [place], [place]   [address] [phone] ? [phone] ? FAX [phone] Web site: [email or web address]

## 2020-05-15 LAB — SURGICAL PATHOLOGY

## 2020-05-15 MED ORDER — AMOXICILLIN-POT CLAVULANATE 875-125 MG PO TABS
1.0000 | ORAL_TABLET | Freq: Two times a day (BID) | ORAL | Status: AC
  Administered 2020-05-15 – 2020-05-16 (×2): 1 via ORAL
  Filled 2020-05-15 (×2): qty 1

## 2020-05-15 MED ORDER — PANTOPRAZOLE SODIUM 40 MG PO TBEC
40.0000 mg | DELAYED_RELEASE_TABLET | Freq: Every day | ORAL | Status: DC
  Administered 2020-05-16: 40 mg via ORAL
  Filled 2020-05-15: qty 1

## 2020-05-15 NOTE — Progress Notes (Signed)
[facility] Progress Note  2 Days Post-Op  Subjective: CC:  Pain controlled without medications. Having flatus. Also belching. Denies nausea or emesis. Mobilizing without assistance.  Objective: Vital signs in last 24 hours: Temp:  [97.7 F (36.5 C)-98.2 F (36.8 C)] 98.2 F (36.8 C) (07/16 0403) Pulse Rate:  [55-60] 56 (07/16 0403) Resp:  [16-18] 17 (07/16 0403) BP: (109-122)/(67-74) 122/74 (07/16 0403) SpO2:  [97 %-100 %] 99 % (07/16 0403) Last BM Date: 05/12/20  Intake/Output from previous day: 07/15 0701 - 07/16 0700 In: 1147.5 [I.V.:1048.3; IV Piggyback:99.2] Out: -  Intake/Output this shift: No intake/output data recorded.  PE: Gen:  Alert, NAD, pleasant Card:  Regular rate and rhythm, pedal pulses 2+ BL Pulm:  Normal effort, clear to auscultation bilaterally Abd: Soft, approp tender, midline incision dressing removed, about 4x2x2 cm, pink moist wound base without fibrinous or purulent exudate Skin: warm and dry, no rashes  Psych: A&Ox3   Lab Results:  Recent Labs    05/13/20 0953 05/14/20 0335  WBC 11.4* 10.5  HGB 13.6 12.0  HCT 41.4 35.8*  PLT 240 222   BMET Recent Labs    05/13/20 0953 05/14/20 0335  NA 140 135  K 4.0 3.9  CL 102 102  CO2 27 21*  GLUCOSE 114* 219*  BUN 14 11  CREATININE 0.67 0.53  CALCIUM 9.5 8.7*   PT/INR No results for input(s): LABPROT, INR in the last 72 hours. CMP     Component Value Date/Time   NA 135 05/14/2020 0335   NA 140 09/11/2019 0835   K 3.9 05/14/2020 0335   CL 102 05/14/2020 0335   CO2 21 (L) 05/14/2020 0335   GLUCOSE 219 (H) 05/14/2020 0335   BUN 11 05/14/2020 0335   BUN 16 09/11/2019 0835   CREATININE 0.53 05/14/2020 0335   CALCIUM 8.7 (L) 05/14/2020 0335   PROT 7.1 05/13/2020 0953   ALBUMIN 4.3 05/13/2020 0953   AST 20 05/13/2020 0953   ALT 19 05/13/2020 0953   ALKPHOS 74 05/13/2020 0953   BILITOT 0.8 05/13/2020 0953   GFRNONAA >60 05/14/2020 0335   GFRAA >60 05/14/2020 0335    Lipase     Component Value Date/Time   LIPASE 26 05/13/2020 0953   Studies/Results: No results found. Anti-infectives: Anti-infectives (From admission, onward)   Start     Dose/Rate Route Frequency Ordered Stop   05/14/20 0445  piperacillin-tazobactam (ZOSYN) IVPB 3.375 g     Discontinue     3.375 g 12.5 mL/hr over 240 Minutes Intravenous Every 8 hours 05/14/20 0439     05/13/20 1330  piperacillin-tazobactam (ZOSYN) IVPB 3.375 g        3.375 g 100 mL/hr over 30 Minutes Intravenous  Once 05/13/20 1328 05/13/20 1721       Assessment/Plan GERD - PPI  RLQ pain- PRN analgesics and antiemetics small bowel perforation/abscess, possible perforated meckel's diverticulum POD#2 s/p diagnostic laparoscopy, mini laparotomy, small bowel resection  7/14 Dr. Marlou Starks - AFVSS - having flatus, no BM - allow FLD and await further return of bowel function - BID wet-to-dry to mini midline incision, RN to educate patients husband on wound care as well. h - OOB, mobilize  - IS 10x q 1h  FEN: saline lock IV, FLD  VTE: SCD's, Lovenox  ID: Zosyn 7/14 >>, plan to D/C abx POD#3 (7/17)  Foley: none  Dispo: floor, IV abx   LOS: 2 days    Obie Dredge, [facility] Please see Amion  for pager number during day hours 7:00am-4:30pm

## 2020-05-15 NOTE — Care Management Important Message (Signed)
Important Message  Patient Details IM Letter given to Roque Lias SW Case Manager to present to the Patient Name: Jenny Henderson MRN: [id number] Date of Birth: [birth year]   Medicare Important Message Given:  Yes     Kerin Salen 05/15/2020, 11:25 AM

## 2020-05-16 LAB — BASIC METABOLIC PANEL
Anion gap: 9 (ref 5–15)
BUN: 20 mg/dL (ref 8–23)
CO2: 27 mmol/L (ref 22–32)
Calcium: 9 mg/dL (ref 8.9–10.3)
Chloride: 103 mmol/L (ref 98–111)
Creatinine, Ser: 0.58 mg/dL (ref 0.44–1.00)
GFR calc Af Amer: >60 mL/min (ref >60)
GFR calc non Af Amer: >60 mL/min (ref >60)
Glucose, Bld: 85 mg/dL (ref 70–99)
Potassium: 4.1 mmol/L (ref 3.5–5.1)
Sodium: 139 mmol/L (ref 135–145)

## 2020-05-16 MED ORDER — AMOXICILLIN-POT CLAVULANATE 875-125 MG PO TABS
1.0000 | ORAL_TABLET | Freq: Two times a day (BID) | ORAL | 0 refills | Status: AC
Start: 2020-05-16 — End: 2020-05-23

## 2020-05-16 MED ORDER — TRAMADOL HCL 50 MG PO TABS: 50.0000 mg | ORAL_TABLET | Freq: Four times a day (QID) | ORAL | 0 refills | Status: DC | PRN

## 2020-05-16 NOTE — Progress Notes (Signed)
Pt called me to the room to report she has had her 1st BM since surgery.  Appearance of burgundy / brown non-formed stool has been passed in the toilet. Asked pt to let the nurse know when  Or if she has another BM so it can be observed, hoping to see a more normal brown BM

## 2020-05-16 NOTE — Progress Notes (Signed)
     Assessment & Plan: POD#3 - small bowel perforation/abscess, possible perforated meckel's diverticulum; s/p diagnostic laparoscopy, mini laparotomy, small bowel resection  7/14 Dr. Marlou Starks  BM this AM  Tolerating full liquid diet - will advance  Dressing changes with husband today  Anticipate discharge home later today if diet tolerated and dressing change without problems.  Will arrange follow up at [place] office with Dr. Marlou Starks.        Jenny Gemma, MD       [facility]       Office: [phone]   Chief Complaint: Perforated small intestine  Subjective: Patient comfortable, wants regular diet.  Burgundy colored BM this AM.  Objective: Vital signs in last 24 hours: Temp:  [98.2 F (36.8 C)-98.4 F (36.9 C)] 98.3 F (36.8 C) (07/17 0553) Pulse Rate:  [66-78] 78 (07/17 0553) Resp:  [16-20] 16 (07/17 0553) BP: (97-125)/(58-84) 97/58 (07/17 0553) SpO2:  [97 %-99 %] 97 % (07/17 0553) Last BM Date: 05/12/20  Intake/Output from previous day: 07/16 0701 - 07/17 0700 In: 944 [P.O.:944] Out: -  Intake/Output this shift: No intake/output data recorded.  Physical Exam: HEENT - sclerae clear, mucous membranes moist Neck - soft Chest - clear bilaterally Cor - RRR Abdomen - soft; dressing dry and intact; non-tender Ext - no edema, non-tender Neuro - alert & oriented, no focal deficits  Lab Results:  Recent Labs    05/13/20 0953 05/14/20 0335  WBC 11.4* 10.5  HGB 13.6 12.0  HCT 41.4 35.8*  PLT 240 222   BMET Recent Labs    05/14/20 0335 05/16/20 0402  NA 135 139  K 3.9 4.1  CL 102 103  CO2 21* 27  GLUCOSE 219* 85  BUN 11 20  CREATININE 0.53 0.58  CALCIUM 8.7* 9.0   PT/INR No results for input(s): LABPROT, INR in the last 72 hours. Comprehensive Metabolic Panel:    Component Value Date/Time   NA 139 05/16/2020 0402   NA 135 05/14/2020 0335   NA 140 09/11/2019 0835   K 4.1 05/16/2020 0402   K 3.9 05/14/2020 0335   CL 103 05/16/2020  0402   CL 102 05/14/2020 0335   CO2 27 05/16/2020 0402   CO2 21 (L) 05/14/2020 0335   BUN 20 05/16/2020 0402   BUN 11 05/14/2020 0335   BUN 16 09/11/2019 0835   CREATININE 0.58 05/16/2020 0402   CREATININE 0.53 05/14/2020 0335   GLUCOSE 85 05/16/2020 0402   GLUCOSE 219 (H) 05/14/2020 0335   CALCIUM 9.0 05/16/2020 0402   CALCIUM 8.7 (L) 05/14/2020 0335   AST 20 05/13/2020 0953   ALT 19 05/13/2020 0953   ALKPHOS 74 05/13/2020 0953   BILITOT 0.8 05/13/2020 0953   PROT 7.1 05/13/2020 0953   ALBUMIN 4.3 05/13/2020 0953    Studies/Results: No results found.    Jenny Henderson 05/16/2020  Patient ID: Jenny Henderson, female   DOB: [birth year], 69 y.o.   MRN: [id number]

## 2020-05-29 ENCOUNTER — Ambulatory Visit: Payer: Medicare HMO

## 2020-05-29 ENCOUNTER — Other Ambulatory Visit: Payer: Self-pay

## 2020-05-29 DIAGNOSIS — I340 Nonrheumatic mitral (valve) insufficiency: Secondary | ICD-10-CM

## 2020-05-31 ENCOUNTER — Other Ambulatory Visit: Payer: Self-pay | Admitting: Cardiology

## 2020-05-31 DIAGNOSIS — R002 Palpitations: Secondary | ICD-10-CM

## 2020-05-31 DIAGNOSIS — R931 Abnormal findings on diagnostic imaging of heart and coronary circulation: Secondary | ICD-10-CM

## 2020-06-08 ENCOUNTER — Other Ambulatory Visit: Payer: Medicare HMO

## 2020-06-21 DIAGNOSIS — E782 Mixed hyperlipidemia: Secondary | ICD-10-CM | POA: Insufficient documentation

## 2020-06-21 NOTE — Progress Notes (Signed)
Patient referred by Aletha Halim., PA-C for chest pain  Subjective:   Jenny Henderson, female    DOB: [birth year], 69 y.o.   MRN: [id number]   Chief Complaint  Patient presents with  . Coronary Artery Disease  . Follow-up    6 month  . Results    echo     HPI  69 y/o Caucasian female with nonobstructive CAD, mod AI, MR.  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  She has not had any perioperative cardiac events.  She is recovering well.  She is seen surgeon later this week for healing of her laparotomy incision.  From cardiac standpoint, she is doing well.  She denies chest pain, shortness of breath, palpitations, leg edema, orthopnea, PND, TIA/syncope.  Current Outpatient Medications on File Prior to Visit  Medication Sig Dispense Refill  . acetaminophen (TYLENOL) 500 MG tablet Take 500 mg by mouth every 6 (six) hours as needed for mild pain.     Marland Kitchen Apoaequorin (PREVAGEN) 10 MG CAPS Take 1 capsule by mouth daily.    Marland Kitchen aspirin EC 81 MG tablet Take 1 tablet (81 mg total) by mouth daily. 90 tablet 3  . carboxymethylcellulose (REFRESH PLUS) 0.5 % SOLN Place 1 drop into the left eye 2 (two) times daily as needed.    . fluticasone (FLONASE) 50 MCG/ACT nasal spray Place 1 spray into both nostrils at bedtime.     . melatonin 5 MG TABS Take 10 mg by mouth at bedtime.    . metoprolol tartrate (LOPRESSOR) 25 MG tablet TAKE 1 TABLET (25 MG TOTAL) BY MOUTH 2 TIMES DAILY. HOLD IF SYSTOLIC BLOOD PRESSURE (TOP BLOOD PRESSURE NUMBER) LESS THAN 100 MMHG OR HEART RATE LESS THAN 60 BPM (PULSE). 60 tablet 3  . Multiple Vitamin (MULTIVITAMIN WITH MINERALS) TABS tablet Take 2 tablets by mouth daily.    Marland Kitchen omeprazole (PRILOSEC) 40 MG capsule Take 40 mg by mouth daily.    . Probiotic Product (PRO-BIOTIC BLEND PO) Take 1 capsule by mouth daily. 60 billion BTU    . rosuvastatin (CRESTOR) 10 MG tablet TAKE 1 TABLET BY MOUTH EVERY DAY (Patient  taking differently: Take 10 mg by mouth daily. ) 90 tablet 1  . traMADol (ULTRAM) 50 MG tablet Take 1-2 tablets (50-100 mg total) by mouth every 6 (six) hours as needed. 15 tablet 0   No current facility-administered medications on file prior to visit.    Cardiovascular studies:  EKG 06/22/2020: Sinus rhythm 61 bpm Normal EKG  Echocardiogram 05/29/2020:  Normal LV systolic function with visual EF 55-60%. Left ventricle cavity  is normal in size. Normal global wall motion. No obvious regional wall  motion abnormalities. Doppler evidence of grade II diastolic dysfunction,  elevated LAP.  Left atrial cavity is moderately dilated.  Mild to moderate aortic regurgitation.  Moderate to severe mitral regurgitation.  Mild tricuspid regurgitation. Mild pulmonary hypertension. RVSP measures  35 mmHg.  IVC is normal with a respiratory response of <50%.  Compared to previous study 08/30/2019 MR is now moderate to severe, mild  PHTN with RVSP 35 mmHG.   CTA coronary 09/2019: 1. Coronary artery calcium score 108 Agatston units. This places the patient in the 75th percentile for age and gender, suggesting moderate to high risk for future cardiac events. 2.  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  Echocardiogram 08/30/2019:  Left ventricle cavity is normal in size. Mild concentric hypertrophy of  the left ventricle. Normal LV  systolic function with EF 55%. Normal global  wall motion. Diastolic function assessment limited due to severity of  mitral regurgitation.  Trileaflet aortic valve. Mild aortic valve leaflet thickening. Trace  aortic valve stenosis. Moderate (Grade II) aortic regurgitation.  Moderate (Grade III) mitral regurgitation. Moderate mitral valve leaflet  thickening.  Mild tricuspid regurgitation. Estimated pulmonary artery systolic pressure  is 21 mmHg.  24-hour Holter monitor 08/28/2019: Sinus rhythm heart rate 44-111 bpm. Average heart rate 65 bpm.  1.2% ventricular  ectopy, 0.2% supraventricular ectopy. No Afib/flutter/high grade AV block, sinus pause > 3sec.  EKG 08/28/2019: Sinus rhythm 64 bpm. Normal EKG.  Recent labs: 05/16/2020: Glucose 85, BUN/Cr 20/0.58. EGFR >60. Na/K 139/4.1.  H/H 12/35. MCV 91. Platelets 222  08/13/2019: Glucose 88.  BUN/creatinine 18/0.69.  EGFR 89.  Sodium 140, potassium 4.3.  Rest of the CMP normal. H/H 13/41.  MCV 93.  Platelets 285. Cholesterol 221, triglycerides 70, HDL 79, LDL 137.   Review of Systems  Cardiovascular: Negative for chest pain, dyspnea on exertion, leg swelling, palpitations and syncope.         Vitals:   06/22/20 1008  BP: 125/68  Pulse: 63  Resp: 16  SpO2: 99%     Body mass index is 22.68 kg/m. Filed Weights   06/22/20 1008  Weight: 124 lb (56.2 kg)     Objective:   Physical Exam Vitals and nursing note reviewed.  Constitutional:      General: She is not in acute distress. Neck:     Vascular: No JVD.  Cardiovascular:     Rate and Rhythm: Normal rate and regular rhythm.     Pulses: Intact distal pulses.     Heart sounds: Murmur heard.  Early systolic murmur is present with a grade of 2/6 at the upper right sternal border.   Pulmonary:     Effort: Pulmonary effort is normal.     Breath sounds: Normal breath sounds. No rales.           Assessment & Recommendations:   69 y/o Caucasian female with nonobstructive CAD, mod AI, MR.  CAD: Mild, nonobstructive on CTA 09/2019. Continue medical management with Aspirin, statin, heart healthy diet and lifestyle.   Dyslipidemia: On Crestor 10 mg. Check lipid panel today.  Mod AI, MR: Clinically asymptomatic.  While PASP is borderline elevated, there is no left ventricular dilatation.  Will repeat echocardiogram in 6 months  Palpitations: Likely due to PAC's. Improved on metoprolol.  F/u in 6 months.   Nigel Mormon, MD [facility] Pager: [phone] Office: [phone] If no answer  Cell [phone]

## 2020-06-22 ENCOUNTER — Encounter: Payer: Self-pay | Admitting: Cardiology

## 2020-06-22 ENCOUNTER — Ambulatory Visit: Payer: Medicare HMO | Admitting: Cardiology

## 2020-06-22 ENCOUNTER — Other Ambulatory Visit: Payer: Self-pay

## 2020-06-22 VITALS — BP 125/68 | HR 63 | Resp 16 | Ht 62.0 in | Wt 124.0 lb

## 2020-06-22 DIAGNOSIS — I340 Nonrheumatic mitral (valve) insufficiency: Secondary | ICD-10-CM

## 2020-06-22 DIAGNOSIS — R931 Abnormal findings on diagnostic imaging of heart and coronary circulation: Secondary | ICD-10-CM

## 2020-06-22 DIAGNOSIS — R002 Palpitations: Secondary | ICD-10-CM

## 2020-06-22 DIAGNOSIS — E782 Mixed hyperlipidemia: Secondary | ICD-10-CM

## 2020-06-22 DIAGNOSIS — I351 Nonrheumatic aortic (valve) insufficiency: Secondary | ICD-10-CM

## 2020-06-23 LAB — LIPID PANEL
Chol/HDL Ratio: 2.4 ratio (ref 0.0–4.4)
Cholesterol, Total: 169 mg/dL (ref 100–199)
HDL: 69 mg/dL (ref >39)
LDL Chol Calc (NIH): 87 mg/dL (ref 0–99)
Triglycerides: 69 mg/dL (ref 0–149)
VLDL Cholesterol Cal: 13 mg/dL (ref 5–40)

## 2020-06-27 ENCOUNTER — Other Ambulatory Visit: Payer: Self-pay | Admitting: Cardiology

## 2020-06-27 DIAGNOSIS — R931 Abnormal findings on diagnostic imaging of heart and coronary circulation: Secondary | ICD-10-CM

## 2020-08-06 ENCOUNTER — Ambulatory Visit: Payer: Medicare HMO | Attending: Internal Medicine

## 2020-08-06 DIAGNOSIS — Z23 Encounter for immunization: Secondary | ICD-10-CM

## 2020-08-06 NOTE — Progress Notes (Signed)
   Covid-19 Vaccination Clinic  Name:  Jenny Henderson    MRN: [id number] DOB: [birth year]  08/06/2020  Jenny Henderson was observed post Covid-19 immunization for 15 minutes without incident. She was provided with Vaccine Information Sheet and instruction to access the V-Safe system.   Jenny Henderson was instructed to call [phone] with any severe reactions post vaccine: Marland Kitchen Difficulty breathing  . Swelling of face and throat  . A fast heartbeat  . A bad rash all over body  . Dizziness and weakness

## 2020-08-28 IMAGING — CT CT ABD-PELV W/O CM
1 of 2 series · 13 of 32 positions shown, 18 images · non-contrast
Comparison: None.

CLINICAL DATA: Acute right lower quadrant abdominal pain.

EXAM:
CT ABDOMEN AND PELVIS WITHOUT CONTRAST
TECHNIQUE: Multidetector CT imaging of the abdomen and pelvis was performed
following the standard protocol without IV contrast.

[Series 2: abd/pelvis w/(date) · axial · 0.68mm/px · z∈[-431,-61]mm · 13 of 84 slices shown, 18 images]
[im 5/84  soft-tissue]
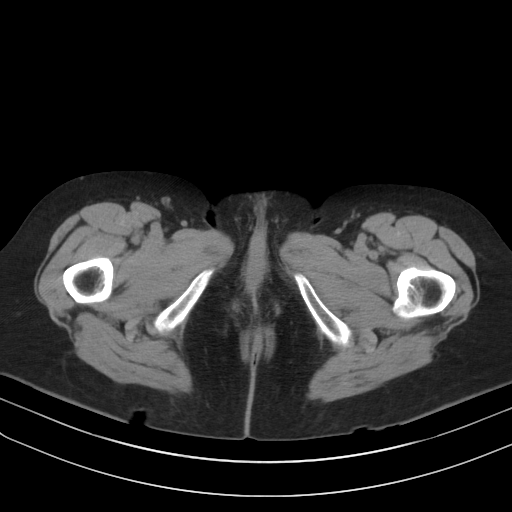
[im 5/84  bone]
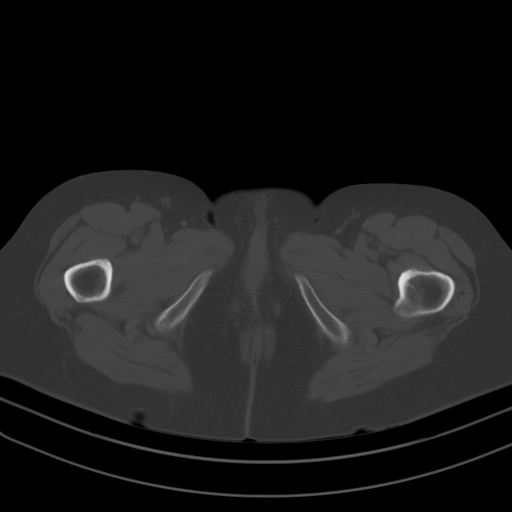
[im 13/84  soft-tissue]
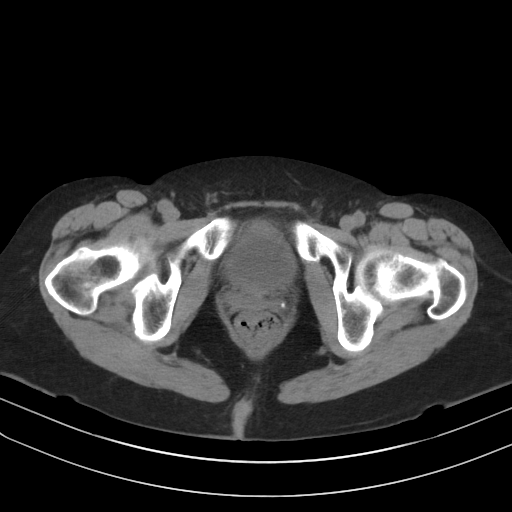
[im 17/84  soft-tissue]
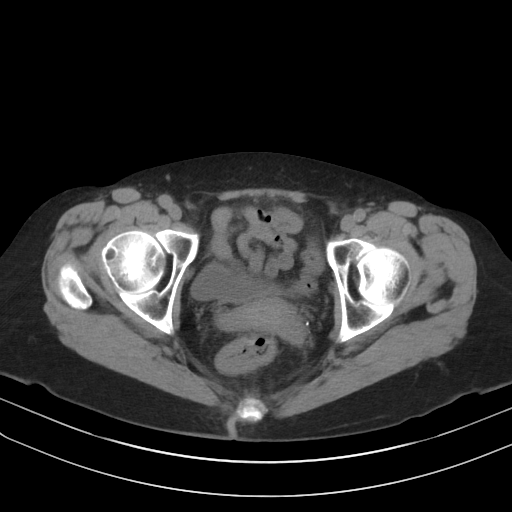
[im 25/84  soft-tissue]
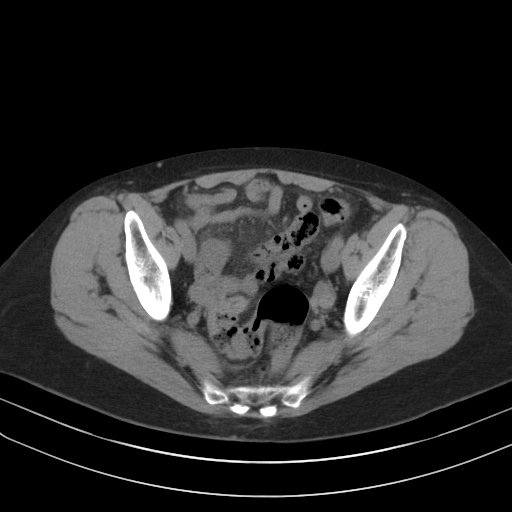
[im 34/84  soft-tissue]
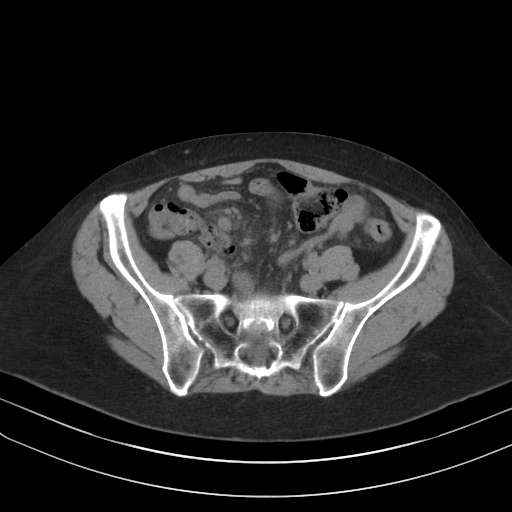
[im 38/84  soft-tissue]
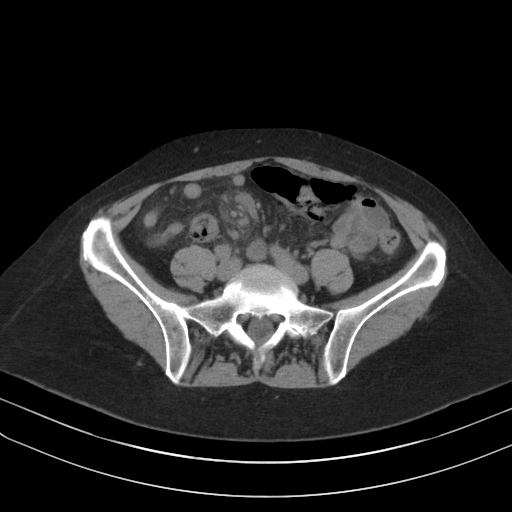
[im 46/84  soft-tissue]
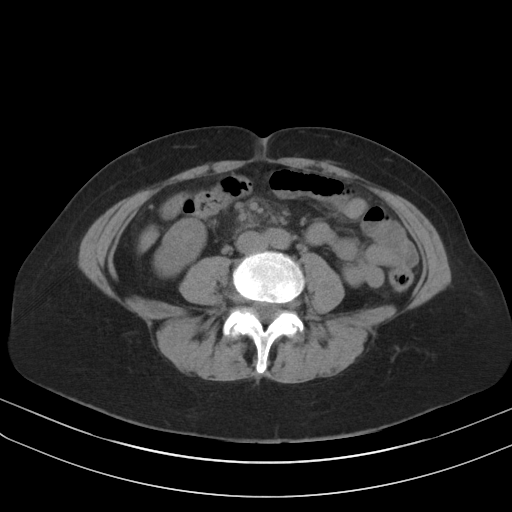
[im 50/84  soft-tissue]
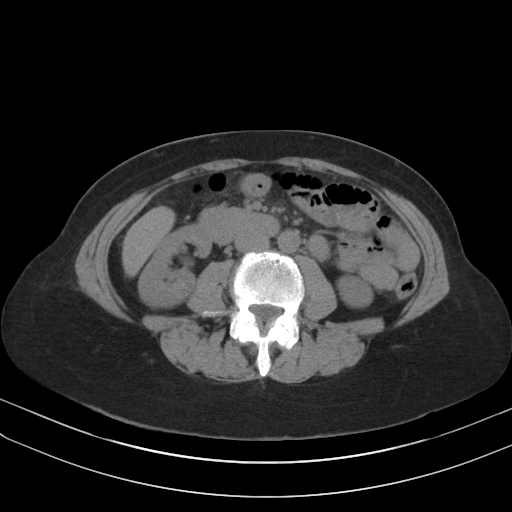
[im 59/84  soft-tissue]
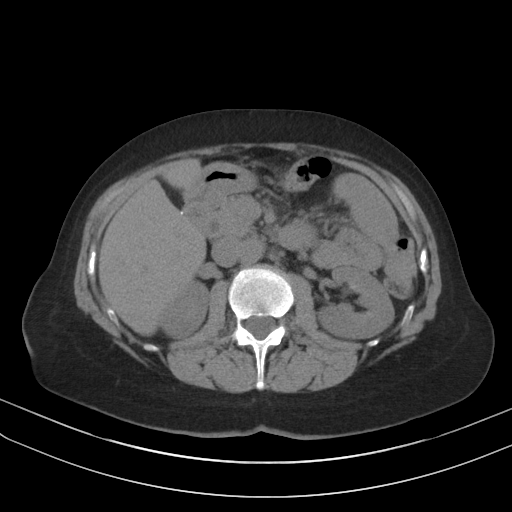
[im 59/84  bone]
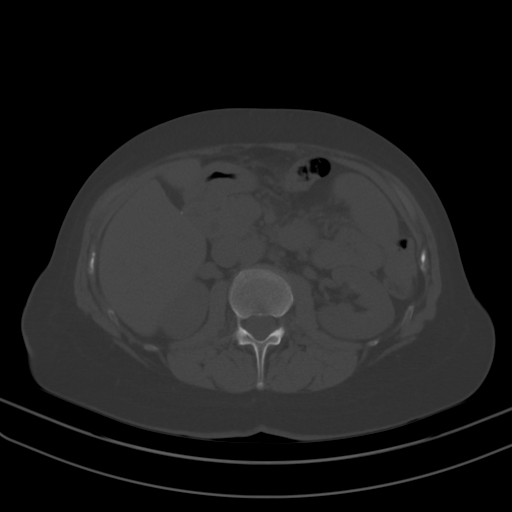
[im 67/84  soft-tissue]
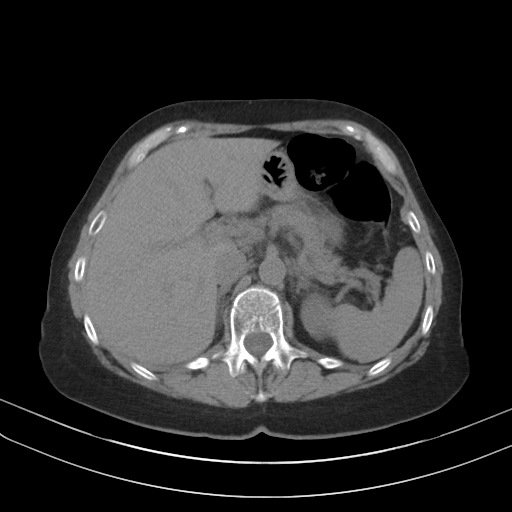
[im 67/84  lung]
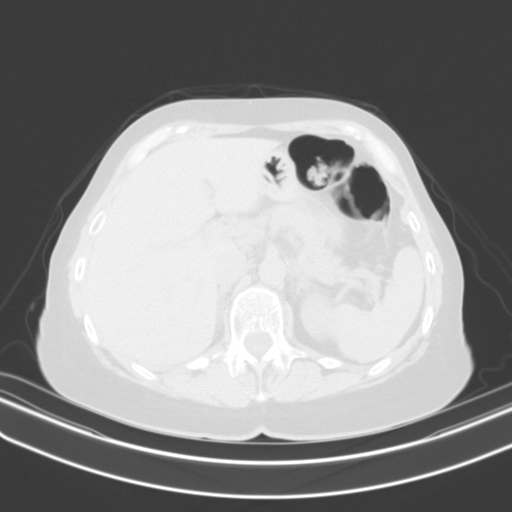
[im 71/84  soft-tissue]
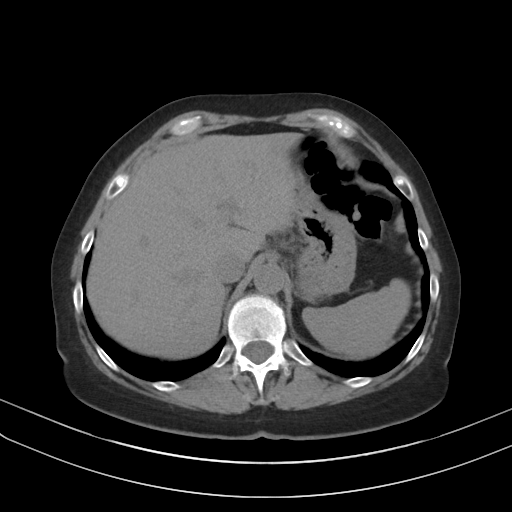
[im 71/84  lung]
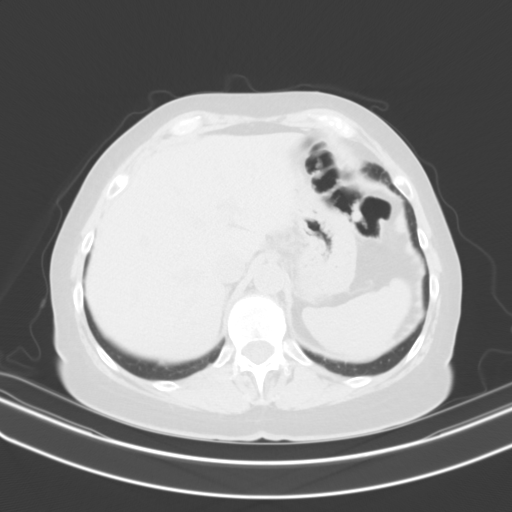
[im 75/84  lung]
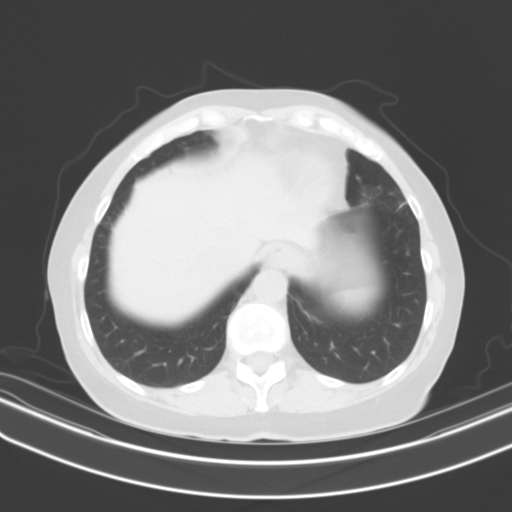
[im 79/84  soft-tissue]
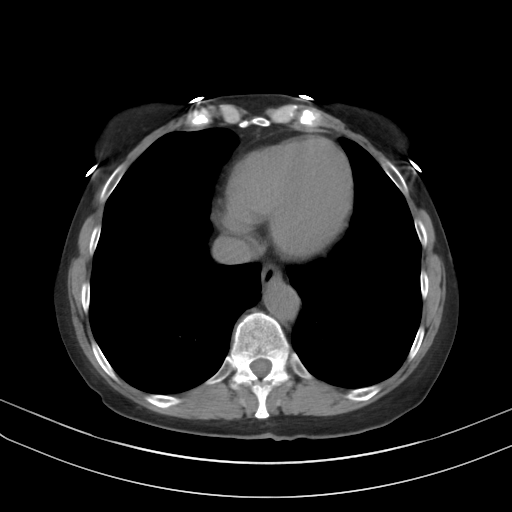
[im 79/84  lung]
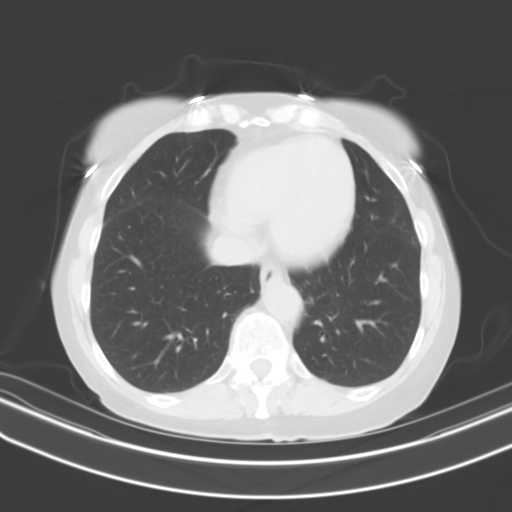

[13 of 32 positions shown; findings below may reference images not displayed]

FINDINGS: Lower chest: No acute abnormality.

Hepatobiliary: No focal liver abnormality is seen. Status post
cholecystectomy. No biliary dilatation.

Pancreas: Unremarkable. No pancreatic ductal dilatation or
surrounding inflammatory changes.

Spleen: Normal in size without focal abnormality.

Adrenals/Urinary Tract: Adrenal glands appear normal. Small
nonobstructive right renal calculus is noted. No hydronephrosis or
renal obstruction is noted. Urinary bladder is unremarkable.

Stomach/Bowel: The stomach is unremarkable. There is a large
inflamed structure seen in the right lower quadrant with maximum
measured diameter of 18 mm with surrounding inflammation. This most
likely represents appendicitis, although inflamed Meckel's
diverticulum cannot be excluded. It is difficult to determine
exactly where this abnormality arises given the patient's redundant
colon, whether distal small bowel or cecum.

Vascular/Lymphatic: No significant vascular abnormality is noted.
Mildly enlarged mesenteric lymph nodes are noted in the right lower
quadrant most likely inflammatory in etiology.

Reproductive: Uterus and bilateral adnexa are unremarkable.

Other: No abdominal wall hernia or abnormality. No abdominopelvic
ascites.

Musculoskeletal: No acute or significant osseous findings.
IMPRESSION: 1. Large inflamed structure is seen in the right lower quadrant with
maximum measured diameter of 18 mm. This may represent acute
appendicitis, although inflamed small bowel or Meckel's diverticulum
cannot be excluded. It is difficult to determine exactly where this
abnormality arises given the patient's redundant colon, whether
small bowel or cecum. Mildly enlarged mesenteric lymph nodes are
noted in the right lower quadrant most likely inflammatory in
etiology. These results will be called to the ordering clinician or
representative by the Radiologist Assistant, and communication
documented in the PACS or zVision Dashboard.
2. Small nonobstructive right renal calculus. No hydronephrosis or
renal obstruction is noted.

## 2020-10-04 ENCOUNTER — Other Ambulatory Visit: Payer: Self-pay | Admitting: Cardiology

## 2020-10-04 DIAGNOSIS — R002 Palpitations: Secondary | ICD-10-CM

## 2020-10-04 DIAGNOSIS — R931 Abnormal findings on diagnostic imaging of heart and coronary circulation: Secondary | ICD-10-CM

## 2020-12-23 ENCOUNTER — Other Ambulatory Visit: Payer: Self-pay

## 2020-12-23 ENCOUNTER — Ambulatory Visit: Payer: Medicare HMO

## 2020-12-23 DIAGNOSIS — I340 Nonrheumatic mitral (valve) insufficiency: Secondary | ICD-10-CM

## 2020-12-30 ENCOUNTER — Ambulatory Visit: Payer: Medicare HMO | Admitting: Cardiology

## 2021-01-07 ENCOUNTER — Other Ambulatory Visit: Payer: Self-pay

## 2021-01-07 ENCOUNTER — Ambulatory Visit: Payer: Medicare HMO | Admitting: Cardiology

## 2021-01-07 ENCOUNTER — Encounter: Payer: Self-pay | Admitting: Cardiology

## 2021-01-07 VITALS — BP 109/66 | HR 60 | Temp 98.2°F | Resp 16 | Ht 62.0 in | Wt 125.0 lb

## 2021-01-07 DIAGNOSIS — I340 Nonrheumatic mitral (valve) insufficiency: Secondary | ICD-10-CM

## 2021-01-07 DIAGNOSIS — R002 Palpitations: Secondary | ICD-10-CM

## 2021-01-07 DIAGNOSIS — I351 Nonrheumatic aortic (valve) insufficiency: Secondary | ICD-10-CM

## 2021-01-07 DIAGNOSIS — R931 Abnormal findings on diagnostic imaging of heart and coronary circulation: Secondary | ICD-10-CM

## 2021-01-07 DIAGNOSIS — E782 Mixed hyperlipidemia: Secondary | ICD-10-CM

## 2021-01-07 MED ORDER — ROSUVASTATIN CALCIUM 10 MG PO TABS: 10.0000 mg | ORAL_TABLET | Freq: Every day | ORAL | 3 refills | Status: DC

## 2021-01-07 MED ORDER — METOPROLOL TARTRATE 25 MG PO TABS: 25.0000 mg | ORAL_TABLET | Freq: Two times a day (BID) | ORAL | 3 refills | Status: DC

## 2021-01-07 NOTE — Progress Notes (Signed)
Patient referred by Aletha Halim., PA-C for chest pain  Subjective:   Jenny Henderson, female    DOB: [birth year], 70 y.o.   MRN: [id number]   Chief Complaint  Patient presents with  . Coronary Artery Disease  . Follow-up    6 month     HPI  70 y/o Caucasian female with nonobstructive CAD, mod AI, MR.  Patient is doing well. She denies chest pain, shortness of breath, palpitations, leg edema, orthopnea, PND, TIA/syncope.  Reviewed recent echocardiogram and lab results with patient, details below.  Current Outpatient Medications on File Prior to Visit  Medication Sig Dispense Refill  . acetaminophen (TYLENOL) 500 MG tablet Take 500 mg by mouth every 6 (six) hours as needed for mild pain.  (Patient not taking: Reported on 06/22/2020)    . Apoaequorin (PREVAGEN) 10 MG CAPS Take 1 capsule by mouth daily.    Marland Kitchen aspirin EC 81 MG tablet Take 1 tablet (81 mg total) by mouth daily. 90 tablet 3  . carboxymethylcellulose (REFRESH PLUS) 0.5 % SOLN Place 1 drop into the left eye 2 (two) times daily as needed.    . fluticasone (FLONASE) 50 MCG/ACT nasal spray Place 1 spray into both nostrils at bedtime.     . melatonin 5 MG TABS Take 10 mg by mouth at bedtime.    . metoprolol tartrate (LOPRESSOR) 25 MG tablet TAKE 1 TABLET 2 TIMES DAILY. HOLD IF SYSTOLIC BLOOD PRESSURE (TOP BLOOD PRESSURE NUMBER) LESS THAN 100 MMHG OR HEART RATE LESS THAN 60 BPM (PULSE). 60 tablet 3  . Multiple Vitamin (MULTIVITAMIN WITH MINERALS) TABS tablet Take 2 tablets by mouth daily.    . Probiotic Product (PRO-BIOTIC BLEND PO) Take 1 capsule by mouth daily. 60 billion BTU    . rosuvastatin (CRESTOR) 10 MG tablet TAKE 1 TABLET BY MOUTH EVERY DAY 90 tablet 1   No current facility-administered medications on file prior to visit.    Cardiovascular studies:  EKG 01/07/2021: Sinus rhythm 60 bpm  Normal EKG  Echocardiogram 12/23/2020:  Left ventricle cavity is normal in size. Normal left ventricular wall   thickness. Normal global wall motion. Normal LV systolic function with EF  68%. Diastolic function not assessed due to severity of mitral  regurgitation. Calculated EF 68%.  Structurally normal trileaflet aortic valve. No evidence of aortic  stenosis. Moderate (Grade II) aortic regurgitation.  Mild mitral valve leaflet thickening with mild restriction. Moderate  (Grade III) mitral regurgitation.  Mild tricuspid regurgitation. Peak RA-RV gradient 19 mmHg.  IVC not visualized.  No significant change compared to previous study on 08/30/2019.   CTA coronary 09/2019: 1. Coronary artery calcium score 108 Agatston units. This places the patient in the 75th percentile for age and gender, suggesting moderate to high risk for future cardiac events. 2.  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  24-hour Holter monitor 08/28/2019: Sinus rhythm heart rate 44-111 bpm. Average heart rate 65 bpm.  1.2% ventricular ectopy, 0.2% supraventricular ectopy. No Afib/flutter/high grade AV block, sinus pause > 3sec.  Recent labs: 06/22/2020 Cholesterol 169, triglycerides 69, HDL 69, LDL 87.  05/16/2020: Glucose 85, BUN/Cr 20/0.58. EGFR >60. Na/K 139/4.1.  H/H 12/35. MCV 91. Platelets 222  08/13/2019: Glucose 88.  BUN/creatinine 18/0.69.  EGFR 89.  Sodium 140, potassium 4.3.  Rest of the CMP normal. H/H 13/41.  MCV 93.  Platelets 285. Cholesterol 221, triglycerides 70, HDL 79, LDL 137   Review of Systems  Cardiovascular: Negative for chest pain, dyspnea on  exertion, leg swelling, palpitations and syncope.         Vitals:   01/07/21 0843  BP: 109/66  Pulse: 60  Resp: 16  Temp: 98.2 F (36.8 C)  SpO2: 98%     Body mass index is 22.86 kg/m. Filed Weights   01/07/21 0843  Weight: 125 lb (56.7 kg)     Objective:   Physical Exam Vitals and nursing note reviewed.  Constitutional:      General: She is not in acute distress. Neck:     Vascular: No JVD.  Cardiovascular:      Rate and Rhythm: Normal rate and regular rhythm.     Pulses: Intact distal pulses.     Heart sounds: Murmur heard.   Early systolic murmur is present with a grade of 2/6 at the upper right sternal border.   Pulmonary:     Effort: Pulmonary effort is normal.     Breath sounds: Normal breath sounds. No rales.           Assessment & Recommendations:   70 y/o Caucasian female with nonobstructive CAD, mod AI, MR.  CAD: Mild, nonobstructive on CTA 09/2019. Calcium score 108 Continue medical management with Aspirin, statin, heart healthy diet and lifestyle.   Dyslipidemia: LDL down to 69 on Crestor 10 mg.  Mod AI, mod MR: Stable, clinically asymptomatic.   Repeat echocardiogram in 1 year  Palpitations: Likely due to PAC's. Improved on metoprolol.  F/u in 1 year  Nigel Mormon, MD [facility] Pager: [phone] Office: [phone] If no answer Cell [phone]

## 2021-10-19 ENCOUNTER — Other Ambulatory Visit: Payer: Self-pay | Admitting: General Surgery

## 2021-10-19 DIAGNOSIS — R1031 Right lower quadrant pain: Secondary | ICD-10-CM

## 2021-11-16 ENCOUNTER — Ambulatory Visit
Admission: RE | Admit: 2021-11-16 | Discharge: 2021-11-16 | Disposition: A | Discharge status: Home / Self Care | Payer: Medicare HMO | Source: Ambulatory Visit | Attending: General Surgery | Admitting: General Surgery

## 2021-11-16 ENCOUNTER — Other Ambulatory Visit: Payer: Self-pay

## 2021-11-16 DIAGNOSIS — R1031 Right lower quadrant pain: Secondary | ICD-10-CM

## 2021-11-16 MED ORDER — IOPAMIDOL (ISOVUE-300) INJECTION 61%
100.0000 mL | Freq: Once | INTRAVENOUS | Status: AC | PRN
  Administered 2021-11-16: 100 mL via INTRAVENOUS

## 2021-12-16 ENCOUNTER — Other Ambulatory Visit: Payer: Self-pay | Admitting: Cardiology

## 2021-12-16 DIAGNOSIS — R002 Palpitations: Secondary | ICD-10-CM

## 2022-01-05 ENCOUNTER — Other Ambulatory Visit: Payer: Self-pay | Admitting: Cardiology

## 2022-01-05 DIAGNOSIS — R931 Abnormal findings on diagnostic imaging of heart and coronary circulation: Secondary | ICD-10-CM

## 2022-01-06 ENCOUNTER — Other Ambulatory Visit: Payer: Self-pay

## 2022-01-06 ENCOUNTER — Ambulatory Visit: Payer: Medicare HMO

## 2022-01-06 DIAGNOSIS — I340 Nonrheumatic mitral (valve) insufficiency: Secondary | ICD-10-CM

## 2022-01-06 DIAGNOSIS — I351 Nonrheumatic aortic (valve) insufficiency: Secondary | ICD-10-CM

## 2022-01-07 ENCOUNTER — Other Ambulatory Visit (HOSPITAL_COMMUNITY): Payer: Self-pay | Admitting: Cardiology

## 2022-01-08 LAB — LIPID PANEL
Chol/HDL Ratio: 1.8 ratio (ref 0.0–4.4)
Cholesterol, Total: 153 mg/dL (ref 100–199)
HDL: 83 mg/dL (ref >39)
LDL Chol Calc (NIH): 57 mg/dL (ref 0–99)
Triglycerides: 64 mg/dL (ref 0–149)
VLDL Cholesterol Cal: 13 mg/dL (ref 5–40)

## 2022-01-14 ENCOUNTER — Ambulatory Visit: Payer: Medicare HMO | Admitting: Cardiology

## 2022-01-14 ENCOUNTER — Encounter: Payer: Self-pay | Admitting: Cardiology

## 2022-01-14 ENCOUNTER — Other Ambulatory Visit: Payer: Self-pay

## 2022-01-14 VITALS — BP 134/71 | HR 62 | Temp 98.0°F | Resp 17 | Ht 62.0 in | Wt 125.0 lb

## 2022-01-14 DIAGNOSIS — I351 Nonrheumatic aortic (valve) insufficiency: Secondary | ICD-10-CM

## 2022-01-14 DIAGNOSIS — I340 Nonrheumatic mitral (valve) insufficiency: Secondary | ICD-10-CM

## 2022-01-14 DIAGNOSIS — R931 Abnormal findings on diagnostic imaging of heart and coronary circulation: Secondary | ICD-10-CM

## 2022-01-14 DIAGNOSIS — I739 Peripheral vascular disease, unspecified: Secondary | ICD-10-CM

## 2022-01-14 DIAGNOSIS — E782 Mixed hyperlipidemia: Secondary | ICD-10-CM

## 2022-01-14 NOTE — Progress Notes (Signed)
? ? ?Patient referred by Aletha Halim., PA-C for chest pain ? ?Subjective:  ? ?Jenny Henderson, female    DOB: 1951-07-29, 71 y.o.   MRN: [id number] ? ? ?Chief Complaint  ?Patient presents with  ? Nonrheumatic mitral valve regurgitation  ? Hyperlipidemia  ? Follow-up  ? ? ? ?HPI ? ?71 y/o Caucasian female with nonobstructive CAD, mod AI, MR. ? ?Patient is doing well. She denies chest pain, shortness of breath, palpitations, leg edema, orthopnea, PND, TIA/syncope.  Recently, she has noticed pain in her thighs after walking, improves with rest.  She had similar pains in the past, but has not recently worsened.  ? ?Reviewed recent echocardiogram and lab results with patient, details below. ? ?Current Outpatient Medications on File Prior to Visit  ?Medication Sig Dispense Refill  ? acetaminophen (TYLENOL) 500 MG tablet Take 500 mg by mouth every 6 (six) hours as needed for mild pain.    ? Apoaequorin (PREVAGEN) 10 MG CAPS Take 1 capsule by mouth daily.    ? aspirin EC 81 MG tablet Take 1 tablet (81 mg total) by mouth daily. 90 tablet 3  ? carboxymethylcellulose (REFRESH PLUS) 0.5 % SOLN Place 1 drop into the left eye 2 (two) times daily as needed.    ? fluticasone (FLONASE) 50 MCG/ACT nasal spray Place 1 spray into both nostrils at bedtime.     ? melatonin 5 MG TABS Take 10 mg by mouth at bedtime.    ? metoprolol tartrate (LOPRESSOR) 25 MG tablet TAKE 1 TABLET BY MOUTH TWICE A DAY 180 tablet 3  ? Multiple Vitamin (MULTIVITAMIN WITH MINERALS) TABS tablet Take 2 tablets by mouth daily.    ? Probiotic Product (PRO-BIOTIC BLEND PO) Take 1 capsule by mouth daily. 60 billion BTU    ? rosuvastatin (CRESTOR) 10 MG tablet TAKE 1 TABLET BY MOUTH EVERY DAY 90 tablet 3  ? ?No current facility-administered medications on file prior to visit.  ? ? ?Cardiovascular studies: ? ?EKG 01/14/2022: ?Sinus rhythm 54 bpm ?Nonspecific T wave abnormality ? ?Echocardiogram 01/06/2022:  ?Normal LV systolic function with visual EF 55-60%. Left  ventricle cavity  ?is normal in size. Normal left ventricular wall thickness. Normal global  ?wall motion. Normal diastolic filling pattern, normal LAP.  ?Aortic valve sclerosis without stenosis. Mild (Grade I) aortic  ?regurgitation.  ?Moderate (Grade III) mitral regurgitation.  ?Mild tricuspid regurgitation. No evidence of pulmonary hypertension. RVSP  ?measures 30 mmHg.  ?Compared to study 12/23/2020 no significant change. ? ?CTA coronary 09/2019: ?1. Coronary artery calcium score 108 Agatston units. This places the ?patient in the 75th percentile for age and gender, suggesting ?moderate to high risk for future cardiac events. ?2.  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 ? ?24-hour Holter monitor 08/28/2019: ?Sinus rhythm heart rate 44-111 bpm.  Average heart rate 65 bpm.   ?1.2% ventricular ectopy, 0.2% supraventricular ectopy. ?No Afib/flutter/high grade AV block, sinus pause > 3sec.  ?  ?Recent labs: ?01/07/2022: ?Chol 153, TG 64, HDL 83, LDL 57. ? ?06/22/2020 ?Chol 169, TG 69, HDL 69, LDL 87. ? ?05/16/2020: ?Glucose 85, BUN/Cr 20/0.58. EGFR >60. Na/K 139/4.1.  ?H/H 12/35. MCV 91. Platelets 222 ? ?08/13/2019: ?Glucose 88.  BUN/creatinine 18/0.69.  EGFR 89.  Sodium 140, potassium 4.3.  Rest of the CMP normal. ?H/H 13/41.  MCV 93.  Platelets 285. ?Cholesterol 221, triglycerides 70, HDL 79, LDL 137 ? ? ?Review of Systems  ?Cardiovascular:  Positive for claudication. Negative for chest pain, dyspnea on exertion, leg swelling,  palpitations and syncope.  ? ?   ? ? ?Vitals:  ? 01/14/22 1018  ?BP: 134/71  ?Pulse: 62  ?Resp: 17  ?Temp: 98 ?F (36.7 ?C)  ?SpO2: 98%  ? ? ? ?Body mass index is 22.86 kg/m?. ?Filed Weights  ? 01/14/22 1018  ?Weight: 125 lb (56.7 kg)  ? ? ? ?Objective:  ? Physical Exam ?Vitals and nursing note reviewed.  ?Constitutional:   ?   General: She is not in acute distress. ?Neck:  ?   Vascular: No JVD.  ?Cardiovascular:  ?   Rate and Rhythm: Normal rate and regular rhythm.  ?   Pulses: Intact distal  pulses.     ?     Femoral pulses are 3+ on the right side and 2+ on the left side. ?     Popliteal pulses are 2+ on the right side and 1+ on the left side.  ?     Dorsalis pedis pulses are 2+ on the right side and 0 on the left side.  ?     Posterior tibial pulses are 1+ on the right side and 0 on the left side.  ?   Heart sounds: Murmur heard.  ?Early systolic murmur is present with a grade of 2/6 at the upper right sternal border.  ?Pulmonary:  ?   Effort: Pulmonary effort is normal.  ?   Breath sounds: Normal breath sounds. No rales.  ? ? ? ?  ICD-10-CM   ?1. Elevated coronary artery calcium score  R93.1   ?  ?2. Nonrheumatic aortic valve insufficiency  I35.1 EKG 12-Lead  ?  ?3. Nonrheumatic mitral valve regurgitation  I34.0   ?  ?4. Mixed hyperlipidemia  E78.2   ?  ?5. Claudication (HCC)  I73.9 PCV LOWER ARTERIAL (BILATERAL)  ?  ? ?Orders Placed This Encounter  ?Procedures  ? EKG 12-Lead  ? PCV LOWER ARTERIAL (BILATERAL)  ? ? ? ?   ?Assessment & Recommendations:  ? ?71 y/o Caucasian female with nonobstructive CAD, mod AI, MR. ? ?CAD: ?Mild, nonobstructive on CTA 09/2019. Calcium score 108 ?Continue medical management with Aspirin, statin, heart healthy diet and lifestyle.  ? ?Claudication: ?Suspect left lower extremity PAD.  Lower extremity ultrasound. ?Continue aspirin, statin, regular walking.   ? ?Mod AI, mod MR: ?Stable, clinically asymptomatic.   ?Repeat echocardiogram in 18 months ? ?F/u in 3 months ? ?Nigel Mormon, MD ?[facility] ?Pager: [phone] ?Office: [phone] ?If no answer Cell [phone] ?   ?

## 2022-01-19 ENCOUNTER — Other Ambulatory Visit: Payer: Self-pay

## 2022-01-19 ENCOUNTER — Ambulatory Visit: Payer: Medicare HMO

## 2022-01-19 DIAGNOSIS — I739 Peripheral vascular disease, unspecified: Secondary | ICD-10-CM

## 2022-04-18 ENCOUNTER — Ambulatory Visit: Payer: Medicare HMO | Admitting: Cardiology

## 2022-04-25 ENCOUNTER — Ambulatory Visit: Payer: Medicare HMO | Admitting: Cardiology

## 2022-04-25 ENCOUNTER — Encounter: Payer: Self-pay | Admitting: Cardiology

## 2022-04-25 VITALS — BP 128/73 | HR 58 | Temp 97.8°F | Resp 16 | Ht 62.0 in | Wt 122.0 lb

## 2022-04-25 DIAGNOSIS — I351 Nonrheumatic aortic (valve) insufficiency: Secondary | ICD-10-CM

## 2022-04-25 DIAGNOSIS — R931 Abnormal findings on diagnostic imaging of heart and coronary circulation: Secondary | ICD-10-CM

## 2022-12-14 ENCOUNTER — Other Ambulatory Visit: Payer: Self-pay | Admitting: Cardiology

## 2022-12-14 DIAGNOSIS — R002 Palpitations: Secondary | ICD-10-CM

## 2022-12-28 ENCOUNTER — Other Ambulatory Visit: Payer: Self-pay | Admitting: Cardiology

## 2022-12-28 DIAGNOSIS — R931 Abnormal findings on diagnostic imaging of heart and coronary circulation: Secondary | ICD-10-CM

## 2023-04-26 ENCOUNTER — Ambulatory Visit: Payer: Medicare HMO

## 2023-04-26 DIAGNOSIS — I351 Nonrheumatic aortic (valve) insufficiency: Secondary | ICD-10-CM

## 2023-05-03 ENCOUNTER — Ambulatory Visit: Payer: Medicare HMO | Admitting: Cardiology

## 2023-05-18 ENCOUNTER — Encounter: Payer: Self-pay | Admitting: Cardiology

## 2023-05-18 ENCOUNTER — Ambulatory Visit: Payer: Medicare HMO | Admitting: Cardiology

## 2023-05-18 VITALS — BP 115/60 | HR 57 | Resp 16 | Ht 62.0 in | Wt 117.8 lb

## 2023-05-18 DIAGNOSIS — I351 Nonrheumatic aortic (valve) insufficiency: Secondary | ICD-10-CM

## 2023-05-18 DIAGNOSIS — I340 Nonrheumatic mitral (valve) insufficiency: Secondary | ICD-10-CM

## 2023-05-18 DIAGNOSIS — R931 Abnormal findings on diagnostic imaging of heart and coronary circulation: Secondary | ICD-10-CM

## 2023-05-18 DIAGNOSIS — E782 Mixed hyperlipidemia: Secondary | ICD-10-CM

## 2023-05-18 NOTE — Progress Notes (Signed)
Patient referred by Richmond Campbell., PA-C for chest pain  Subjective:   Jenny Henderson, female    DOB: 24-Jul-1951, 72 y.o.   MRN: [id number]   Chief Complaint  Patient presents with   Follow-up    1 year   Elevated coronary artery calcium score     HPI  72 y/o Caucasian female with nonobstructive CAD, mod AI, MR.  Patient is doing well. She denies chest pain, shortness of breath, palpitations, leg edema, orthopnea, PND, TIA/syncope.  She has occasional "twinges" in chest that last only for a second or so.  Reviewed recent test results with the patient, details below.     Current Outpatient Medications:    acetaminophen (TYLENOL) 500 MG tablet, Take 500 mg by mouth every 6 (six) hours as needed for mild pain., Disp: , Rfl:    alendronate (FOSAMAX) 70 MG tablet, Take 70 mg by mouth once a week., Disp: , Rfl:    aspirin EC 81 MG tablet, Take 1 tablet (81 mg total) by mouth daily., Disp: 90 tablet, Rfl: 3   carboxymethylcellulose (REFRESH PLUS) 0.5 % SOLN, Place 1 drop into the left eye 2 (two) times daily as needed., Disp: , Rfl:    fluticasone (FLONASE) 50 MCG/ACT nasal spray, Place 1 spray into both nostrils at bedtime. , Disp: , Rfl:    metoprolol tartrate (LOPRESSOR) 25 MG tablet, TAKE 1 TABLET BY MOUTH TWICE A DAY, Disp: 180 tablet, Rfl: 3   Multiple Vitamin (MULTIVITAMIN WITH MINERALS) TABS tablet, Take 2 tablets by mouth daily., Disp: , Rfl:    Probiotic Product (PRO-BIOTIC BLEND PO), Take 1 capsule by mouth daily. 60 billion BTU, Disp: , Rfl:    rosuvastatin (CRESTOR) 10 MG tablet, TAKE 1 TABLET BY MOUTH EVERY DAY, Disp: 90 tablet, Rfl: 3   Cardiovascular studies:  EKG 05/18/2023: Sinus rhythm 53 bpm Normal EKG  Echocardiogram 04/26/2023: Left ventricle cavity is normal in size. Normal left ventricular wall thickness. Normal global wall motion. Normal LV systolic function with EF 59%. Normal diastolic filling pattern. Calculated EF 59%. Trileaflet aortic  valve. Mild aortic valve leaflet calcification. Mild (Grade I) aortic regurgitation. Mild calcification of the mitral valve annulus. Moderate (Grade III) mitral regurgitation. No evidence of pulmonary hypertension. No significant change compared to previous study on 01/06/2022.  Lower Extremity Arterial Duplex 01/19/2022:  No hemodynamically significant stenosis are identified in the bilateral  lower extremity arterial system.    This exam reveals normal perfusion of the right lower extremity (ABI 1.12)  and normal perfusion of the left lower extremity (ABI 1.11) with normal  triphasic waveform pattern at the level of the ankles.  Evaluate for pseudoclaudication.   CTA coronary 09/2019: LM: Calcified plaque distal LM, minimal stenosis LAD: Calcified plaque ostial LAD, minimal stenosis          Large D1, mixed plaque. Mild stenosis          Mixed plaque in prox LAD near D1 origin, mild stenosis Lcx: No plaque or stenosis RCA: No plaque or stenosis  Calcium score: 108 (75th percentile)  24-hour Holter monitor 08/28/2019: Sinus rhythm heart rate 44-111 bpm.  Average heart rate 65 bpm.   1.2% ventricular ectopy, 0.2% supraventricular ectopy. No Afib/flutter/high grade AV block, sinus pause > 3sec.    Recent labs: 09/01/2022: Glucose 88, BUN/Cr 24/0.81. EGFR 77. Na/K 141/4.5. Rest of the CMP normal H/H 14/43. MCV 94. Platelets 273 HbA1C NA Chol 173, TG 78, HDL 85, LDL 70 TSH 1.9  normal  08/13/2019: Glucose 88.  BUN/creatinine 18/0.69.  EGFR 89.  Sodium 140, potassium 4.3.  Rest of the CMP normal. H/H 13/41.  MCV 93.  Platelets 285. Cholesterol 221, triglycerides 70, HDL 79, LDL 137   Review of Systems  Cardiovascular:  Positive for claudication. Negative for chest pain, dyspnea on exertion, leg swelling, palpitations and syncope.         Vitals:   05/18/23 1534  BP: 115/60  Pulse: (!) 57  Resp: 16  SpO2: 97%      Body mass index is 21.55 kg/m. Filed Weights    05/18/23 1534  Weight: 117 lb 12.8 oz (53.4 kg)      Objective:   Physical Exam Vitals and nursing note reviewed.  Constitutional:      General: She is not in acute distress. Neck:     Vascular: No JVD.  Cardiovascular:     Rate and Rhythm: Normal rate and regular rhythm.     Pulses: Intact distal pulses.          Femoral pulses are 3+ on the right side and 2+ on the left side.      Popliteal pulses are 2+ on the right side and 1+ on the left side.       Dorsalis pedis pulses are 2+ on the right side and 0 on the left side.       Posterior tibial pulses are 1+ on the right side and 0 on the left side.     Heart sounds: Murmur heard.     Early systolic murmur is present with a grade of 2/6 at the upper right sternal border.  Pulmonary:     Effort: Pulmonary effort is normal.     Breath sounds: Normal breath sounds. No rales.  Musculoskeletal:     Right lower leg: No edema.     Left lower leg: No edema.     Visit diagnoses:   ICD-10-CM   1. Elevated coronary artery calcium score  R93.1 EKG 12-Lead    2. Nonrheumatic aortic valve insufficiency  I35.1 PCV ECHOCARDIOGRAM COMPLETE    3. Mixed hyperlipidemia  E78.2     4. Nonrheumatic mitral valve regurgitation  I34.0 PCV ECHOCARDIOGRAM COMPLETE      Orders Placed This Encounter  Procedures   EKG 12-Lead   PCV ECHOCARDIOGRAM COMPLETE        Assessment & Recommendations:   72 y/o Caucasian female with nonobstructive CAD, mod AI, MR.  CAD: Mild, nonobstructive on CTA 09/2019. Calcium score 108 Mild plaque in distal LM, ostial LAD Lipids well controlled. Continue medical management with Aspirin, statin, heart healthy diet and lifestyle.   Palpitations: Well controlled with metoprolol.  Mod AI, mod MR: Stable, clinically asymptomatic.   Repeat echocardiogram in 1 year  F/u in 1 year   Elder Negus, MD Pager: [phone] Office: [phone]

## 2023-12-02 ENCOUNTER — Other Ambulatory Visit: Payer: Self-pay | Admitting: Cardiology

## 2023-12-02 DIAGNOSIS — R002 Palpitations: Secondary | ICD-10-CM

## 2023-12-05 ENCOUNTER — Other Ambulatory Visit: Payer: Self-pay | Admitting: Cardiology

## 2023-12-05 DIAGNOSIS — R931 Abnormal findings on diagnostic imaging of heart and coronary circulation: Secondary | ICD-10-CM

## 2024-03-12 ENCOUNTER — Other Ambulatory Visit: Payer: Self-pay

## 2024-03-12 ENCOUNTER — Telehealth: Payer: Self-pay

## 2024-03-12 DIAGNOSIS — M810 Age-related osteoporosis without current pathological fracture: Secondary | ICD-10-CM | POA: Insufficient documentation

## 2024-03-12 NOTE — Telephone Encounter (Signed)
 Auth Submission: NO AUTH NEEDED Site of care: Site of care: [facility] Payer: [employer] Medication & CPT/J Code(s) submitted: Reclast (Zolendronic acid) [id number] Route of submission (phone, fax, portal): portal Phone # Fax # Auth type: Buy/Bill PB Units/visits requested: 5mg , 1 dose Reference number:  Approval from: 03/12/24 to 10/30/24

## 2024-03-21 ENCOUNTER — Encounter: Attending: Obstetrics and Gynecology | Admitting: *Deleted

## 2024-03-21 VITALS — BP 106/61 | HR 48 | Temp 97.7°F | Resp 16

## 2024-03-21 DIAGNOSIS — M810 Age-related osteoporosis without current pathological fracture: Secondary | ICD-10-CM | POA: Diagnosis not present

## 2024-03-21 MED ORDER — ACETAMINOPHEN 325 MG PO TABS
650.0000 mg | ORAL_TABLET | Freq: Once | ORAL | Status: AC
  Administered 2024-03-21: 650 mg via ORAL

## 2024-03-21 MED ORDER — DIPHENHYDRAMINE HCL 25 MG PO CAPS
25.0000 mg | ORAL_CAPSULE | Freq: Once | ORAL | Status: AC
  Administered 2024-03-21: 25 mg via ORAL

## 2024-03-21 MED ORDER — ZOLEDRONIC ACID 5 MG/100ML IV SOLN
5.0000 mg | Freq: Once | INTRAVENOUS | Status: AC
  Administered 2024-03-21: 5 mg via INTRAVENOUS

## 2024-03-21 NOTE — Progress Notes (Signed)
 Diagnosis: Osteoporosis  Provider:  Ashby Lawman  Procedure: IV Infusion  IV Type: Peripheral, IV Location: L Antecubital  Reclast (Zolendronic Acid), Dose: 5 mg  Infusion Start Time: 1016  Infusion Stop Time: 1046  Post Infusion IV Care: Observation period completed  Discharge: Condition: Good, Destination: Home . AVS Provided  Performed by:  Verneda Golder, RN

## 2024-05-17 ENCOUNTER — Ambulatory Visit (HOSPITAL_COMMUNITY)
Admission: RE | Admit: 2024-05-17 | Discharge: 2024-05-17 | Disposition: A | Discharge status: Home / Self Care | Payer: Medicare HMO | Source: Ambulatory Visit | Attending: Cardiology | Admitting: Cardiology

## 2024-05-17 ENCOUNTER — Other Ambulatory Visit: Payer: Medicare HMO

## 2024-05-17 DIAGNOSIS — I77810 Thoracic aortic ectasia: Secondary | ICD-10-CM | POA: Diagnosis not present

## 2024-05-17 DIAGNOSIS — I340 Nonrheumatic mitral (valve) insufficiency: Secondary | ICD-10-CM | POA: Diagnosis not present

## 2024-05-17 DIAGNOSIS — I5030 Unspecified diastolic (congestive) heart failure: Secondary | ICD-10-CM | POA: Diagnosis not present

## 2024-05-17 DIAGNOSIS — I083 Combined rheumatic disorders of mitral, aortic and tricuspid valves: Secondary | ICD-10-CM

## 2024-05-17 DIAGNOSIS — I351 Nonrheumatic aortic (valve) insufficiency: Secondary | ICD-10-CM

## 2024-05-17 LAB — ECHOCARDIOGRAM COMPLETE
Area-P 1/2: 3.68 cm2
P 1/2 time: 624 ms
S' Lateral: 2.1 cm

## 2024-05-18 ENCOUNTER — Ambulatory Visit: Payer: Self-pay | Admitting: Cardiology

## 2024-05-18 NOTE — Progress Notes (Signed)
 Normal pumping function of the heart. Moderate leakiness of aortic valve. This is likely due to dilation of aorta (mild) noted on CT scan in 2020 as well. Can discuss during office visit. Last seen by me in 04/2024 and recommended 1 year follow up. Please schedule non-urgent follow up with me.  Thanks MJP

## 2024-05-23 ENCOUNTER — Ambulatory Visit: Payer: Self-pay | Admitting: Cardiology

## 2024-05-27 ENCOUNTER — Other Ambulatory Visit: Payer: Self-pay | Admitting: Cardiology

## 2024-05-27 DIAGNOSIS — R931 Abnormal findings on diagnostic imaging of heart and coronary circulation: Secondary | ICD-10-CM

## 2024-06-03 ENCOUNTER — Ambulatory Visit: Attending: Cardiology | Admitting: Cardiology

## 2024-06-03 ENCOUNTER — Encounter: Payer: Self-pay | Admitting: Cardiology

## 2024-06-03 VITALS — BP 128/60 | HR 53 | Ht 62.0 in | Wt 123.2 lb

## 2024-06-03 DIAGNOSIS — R002 Palpitations: Secondary | ICD-10-CM | POA: Diagnosis not present

## 2024-06-03 DIAGNOSIS — E782 Mixed hyperlipidemia: Secondary | ICD-10-CM | POA: Diagnosis not present

## 2024-06-03 DIAGNOSIS — I351 Nonrheumatic aortic (valve) insufficiency: Secondary | ICD-10-CM

## 2024-06-03 DIAGNOSIS — R931 Abnormal findings on diagnostic imaging of heart and coronary circulation: Secondary | ICD-10-CM

## 2024-06-03 DIAGNOSIS — I77810 Thoracic aortic ectasia: Secondary | ICD-10-CM | POA: Diagnosis not present

## 2024-06-03 MED ORDER — METOPROLOL TARTRATE 25 MG PO TABS: 25.0000 mg | ORAL_TABLET | Freq: Two times a day (BID) | ORAL | 3 refills | Status: AC

## 2024-06-03 MED ORDER — ASPIRIN EC 81 MG PO TBEC: 81.0000 mg | DELAYED_RELEASE_TABLET | Freq: Every day | ORAL | 3 refills | Status: AC

## 2024-06-03 MED ORDER — ROSUVASTATIN CALCIUM 10 MG PO TABS: 10.0000 mg | ORAL_TABLET | Freq: Every day | ORAL | 3 refills | Status: AC

## 2024-06-03 NOTE — Progress Notes (Addendum)
 Cardiology Office Note:  .   Date:  06/03/2024  ID:  Jenny Henderson, DOB 17-Aug-1951, MRN [id number] PCP: Debrah Josette MOHR PA-C  [facility] Providers Cardiologist:  Newman Lawrence, MD PCP: Debrah Josette ORN., PA-C  Chief Complaint  Patient presents with   aortic dialation     Jenny Henderson is a 73 y.o. female with nonobstructive CAD, aortic regurgitation, dilated ascending aorta  History of Present Illness  Patient is no cardiac complaints today.  She is now on memantine for mild memory issues.  Her mother had Alzheimer's disease.  Reviewed recent echocardiogram results with the patient, details below.  She is starting aspirin  without any bleeding issues.     Vitals:   06/03/24 1405  BP: 128/60  Pulse: (!) 53  SpO2: 97%      Review of Systems  Cardiovascular:  Negative for chest pain, dyspnea on exertion, leg swelling, palpitations and syncope.        Studies Reviewed: SABRA       EKG 06/03/2024: Sinus bradycardia with sinus arrhythmia When compared with ECG of 28-Jan-2015 04:55, No significant change was found  Echocardiogram 04/2024: 1. Left ventricular ejection fraction, by estimation, is 60 to 65%. The  left ventricle has normal function. The left ventricle has no regional  wall motion abnormalities. Left ventricular diastolic parameters are  consistent with Grade II diastolic  dysfunction (pseudonormalization). The average left ventricular global  longitudinal strain is -21.9 %. The global longitudinal strain is normal.   2. Right ventricular systolic function is normal. The right ventricular  size is normal. There is normal pulmonary artery systolic pressure.   3. The mitral valve is normal in structure. Moderate mitral valve  regurgitation. No evidence of mitral stenosis.   4. The aortic valve is tricuspid. There is mild calcification of the  aortic valve. Aortic valve regurgitation is mild to moderate. Aortic valve  sclerosis is  present, with no evidence of aortic valve stenosis.   5. Aortic dilatation noted. There is mild dilatation of the ascending  aorta, measuring 42 mm.   6. The inferior vena cava is normal in size with greater than 50%  respiratory variability, suggesting right atrial pressure of 3 mmHg.   Comparison(s): Prior images unable to be directly viewed, comparison made  by report only.   CT cardiac scoring 2020: 1. Coronary artery calcium  score 108 Agatston units. This places the patient in the 75th percentile for age and gender, suggesting moderate to high risk for future cardiac events. Vascular: Nonaneurysmal dilatation of the ascending aorta, including at 3.6 cm. Tortuous thoracic aorta. Aortic atherosclerosis. No central pulmonary embolism, on this non-dedicated study. 2.  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   Labs 09/2023: Chol 162, TG 67, HDL 73, LDL 75 Hb 14.1 Cr 9.28 TSH 1.6   Physical Exam Vitals and nursing note reviewed.  Constitutional:      General: She is not in acute distress. Neck:     Vascular: No JVD.  Cardiovascular:     Rate and Rhythm: Normal rate and regular rhythm.     Heart sounds: Normal heart sounds. No murmur heard. Pulmonary:     Effort: Pulmonary effort is normal.     Breath sounds: Normal breath sounds. No wheezing or rales.  Musculoskeletal:     Right lower leg: No edema.     Left lower leg: No edema.      VISIT DIAGNOSES:   ICD-10-CM   1. Elevated coronary artery calcium  score  R93.1 EKG 12-Lead    aspirin  EC 81 MG tablet    rosuvastatin  (CRESTOR ) 10 MG tablet    2. Mixed hyperlipidemia  E78.2 EKG 12-Lead    Lipid panel    Basic metabolic panel with GFR    Basic metabolic panel with GFR    Lipid panel    3. Palpitations  R00.2 metoprolol  tartrate (LOPRESSOR ) 25 MG tablet    4. Ascending aorta dilation (HCC)  I77.810 CT ANGIO CHEST AORTA W/CM & OR WO/CM    5. Nonrheumatic aortic valve insufficiency  I35.1        Jenny  Miyonna Henderson is a 73 y.o. female with nonobstructive CAD, aortic regurgitation, dilated ascending aorta Assessment & Plan  Aortic regurgitation, ascending aorta dilatation: Check CTA aorta to establish correlation with echocardiographic findings.  Aortic regurgitation is only mild to moderate.  Continue metoprolol  to tartrate for with which heart rate and blood pressure are well-controlled.  Nonobstructive CAD: Tolerating aspirin  81 mg daily without any bleeding issues. Continue Crestor  10 mg daily.   Meds ordered this encounter  Medications   aspirin  EC 81 MG tablet    Sig: Take 1 tablet (81 mg total) by mouth daily.    Dispense:  90 tablet    Refill:  3   metoprolol  tartrate (LOPRESSOR ) 25 MG tablet    Sig: Take 1 tablet (25 mg total) by mouth 2 (two) times daily.    Dispense:  180 tablet    Refill:  3   rosuvastatin  (CRESTOR ) 10 MG tablet    Sig: Take 1 tablet (10 mg total) by mouth daily.    Dispense:  90 tablet    Refill:  3     F/u in 1 year  Signed, Newman JINNY Lawrence, MD

## 2024-06-03 NOTE — Patient Instructions (Signed)
 Medication Instructions:  Refill for Aspirin , Metoprolol  tartrate and rosuvastatin   Lab Work: Lipid panel and BMP today   Testing/Procedures: Non-Cardiac CT scanning, (CAT scanning), is a noninvasive, special x-ray that produces cross-sectional images of the body using x-rays and a computer. CT scans help physicians diagnose and treat medical conditions. For some CT exams, a contrast material is used to enhance visibility in the area of the body being studied. CT scans provide greater clarity and reveal more details than regular x-ray exams.   Follow-Up: Your next appointment:   1 year(s)  Provider:   Newman JINNY Lawrence, MD

## 2024-06-04 ENCOUNTER — Ambulatory Visit: Payer: Self-pay

## 2024-06-04 DIAGNOSIS — I77810 Thoracic aortic ectasia: Secondary | ICD-10-CM

## 2024-06-04 DIAGNOSIS — I351 Nonrheumatic aortic (valve) insufficiency: Secondary | ICD-10-CM

## 2024-06-04 DIAGNOSIS — I340 Nonrheumatic mitral (valve) insufficiency: Secondary | ICD-10-CM

## 2024-06-04 LAB — BASIC METABOLIC PANEL WITH GFR
BUN/Creatinine Ratio: 26 (ref 12–28)
BUN: 17 mg/dL (ref 8–27)
CO2: 22 mmol/L (ref 20–29)
Calcium: 9.3 mg/dL (ref 8.7–10.3)
Chloride: 104 mmol/L (ref 96–106)
Creatinine, Ser: 0.66 mg/dL (ref 0.57–1.00)
Glucose: 97 mg/dL (ref 70–99)
Potassium: 4.2 mmol/L (ref 3.5–5.2)
Sodium: 142 mmol/L (ref 134–144)
eGFR: 92 mL/min/1.73 (ref >59)

## 2024-06-04 LAB — LIPID PANEL
Chol/HDL Ratio: 1.8 ratio (ref 0.0–4.4)
Cholesterol, Total: 164 mg/dL (ref 100–199)
HDL: 91 mg/dL (ref >39)
LDL Chol Calc (NIH): 62 mg/dL (ref 0–99)
Triglycerides: 51 mg/dL (ref 0–149)
VLDL Cholesterol Cal: 11 mg/dL (ref 5–40)

## 2024-06-21 ENCOUNTER — Ambulatory Visit (HOSPITAL_COMMUNITY)
Admission: RE | Admit: 2024-06-21 | Discharge: 2024-06-21 | Disposition: A | Discharge status: Home / Self Care | Source: Ambulatory Visit | Attending: Cardiology | Admitting: Cardiology

## 2024-06-21 DIAGNOSIS — I77810 Thoracic aortic ectasia: Secondary | ICD-10-CM | POA: Insufficient documentation

## 2024-06-21 MED ORDER — IOHEXOL 350 MG/ML SOLN
75.0000 mL | Freq: Once | INTRAVENOUS | Status: AC | PRN
  Administered 2024-06-21: 75 mL via INTRAVENOUS

## 2024-07-03 ENCOUNTER — Telehealth: Payer: Self-pay | Admitting: Cardiology

## 2024-07-03 NOTE — Telephone Encounter (Signed)
 Pt called in asking for CT results, informed her they haven't been reviewed yet but nurse will call once they are. She asked that I send a message to let them know.  I did also send a link to get mychart set up in case they are put in there.

## 2024-07-03 NOTE — Progress Notes (Signed)
 Ascending aorta 4.0 cm on CTA, was measured 4.2 cm on echocardiogram. This is fairly comparable. Recommend repeat echocardiogram in 1 year to monitor aortic regurgitation and ascending aorta dilatation.  Thanks MJP

## 2024-07-03 NOTE — Telephone Encounter (Signed)
 See result messages (sent today).  Thanks MJP

## 2024-07-04 NOTE — Telephone Encounter (Signed)
 MyChart message sent to pt

## 2025-03-25 ENCOUNTER — Ambulatory Visit
